# Patient Record
Sex: Female | Born: 1945 | Race: Black or African American | Hispanic: No | Marital: Married | Smoking: Never smoker
Health system: Southern US, Community
[De-identification: ages and names within clinical notes are randomized; demographics above are authoritative.]

## PROBLEM LIST (undated history)

## (undated) DIAGNOSIS — R011 Cardiac murmur, unspecified: Secondary | ICD-10-CM

## (undated) DIAGNOSIS — K635 Polyp of colon: Secondary | ICD-10-CM

## (undated) DIAGNOSIS — J40 Bronchitis, not specified as acute or chronic: Secondary | ICD-10-CM

## (undated) DIAGNOSIS — E162 Hypoglycemia, unspecified: Secondary | ICD-10-CM

## (undated) DIAGNOSIS — I10 Essential (primary) hypertension: Secondary | ICD-10-CM

## (undated) DIAGNOSIS — Z460 Encounter for fitting and adjustment of spectacles and contact lenses: Secondary | ICD-10-CM

## (undated) DIAGNOSIS — R0989 Other specified symptoms and signs involving the circulatory and respiratory systems: Secondary | ICD-10-CM

## (undated) DIAGNOSIS — Z972 Presence of dental prosthetic device (complete) (partial): Secondary | ICD-10-CM

## (undated) HISTORY — DX: Encounter for fitting and adjustment of spectacles and contact lenses: Z46.0

## (undated) HISTORY — DX: Bronchitis, not specified as acute or chronic: J40

## (undated) HISTORY — DX: Polyp of colon: K63.5

## (undated) HISTORY — DX: Other specified symptoms and signs involving the circulatory and respiratory systems: R09.89

## (undated) HISTORY — DX: Essential (primary) hypertension: I10

## (undated) HISTORY — DX: Cardiac murmur, unspecified: R01.1

## (undated) HISTORY — DX: Presence of dental prosthetic device (complete) (partial): Z97.2

## (undated) HISTORY — DX: Hypoglycemia, unspecified: E16.2

---

## 1994-01-15 HISTORY — PX: ABDOMINAL HYSTERECTOMY: SHX81

## 2002-04-20 ENCOUNTER — Encounter: Payer: Self-pay | Admitting: Gastroenterology

## 2002-04-20 ENCOUNTER — Ambulatory Visit (HOSPITAL_COMMUNITY): Admission: RE | Admit: 2002-04-20 | Discharge: 2002-04-20 | Payer: Self-pay | Admitting: Gastroenterology

## 2002-05-12 ENCOUNTER — Ambulatory Visit (HOSPITAL_COMMUNITY): Admission: RE | Admit: 2002-05-12 | Discharge: 2002-05-12 | Payer: Self-pay | Admitting: Gastroenterology

## 2002-06-22 ENCOUNTER — Encounter: Payer: Self-pay | Admitting: Family Medicine

## 2002-06-22 ENCOUNTER — Encounter: Admission: RE | Admit: 2002-06-22 | Discharge: 2002-06-22 | Payer: Self-pay | Admitting: Family Medicine

## 2002-07-23 ENCOUNTER — Encounter: Payer: Self-pay | Admitting: General Surgery

## 2002-07-29 ENCOUNTER — Encounter (INDEPENDENT_AMBULATORY_CARE_PROVIDER_SITE_OTHER): Payer: Self-pay | Admitting: *Deleted

## 2002-07-29 ENCOUNTER — Ambulatory Visit (HOSPITAL_COMMUNITY): Admission: RE | Admit: 2002-07-29 | Discharge: 2002-07-30 | Payer: Self-pay | Admitting: General Surgery

## 2002-07-29 ENCOUNTER — Encounter: Payer: Self-pay | Admitting: General Surgery

## 2002-08-16 HISTORY — PX: CHOLECYSTECTOMY: SHX55

## 2004-05-09 ENCOUNTER — Emergency Department (HOSPITAL_COMMUNITY): Admission: EM | Admit: 2004-05-09 | Discharge: 2004-05-09 | Payer: Self-pay | Admitting: Emergency Medicine

## 2004-08-16 ENCOUNTER — Emergency Department (HOSPITAL_COMMUNITY): Admission: EM | Admit: 2004-08-16 | Discharge: 2004-08-16 | Payer: Self-pay | Admitting: Emergency Medicine

## 2005-10-21 ENCOUNTER — Ambulatory Visit (HOSPITAL_COMMUNITY): Admission: RE | Admit: 2005-10-21 | Discharge: 2005-10-21 | Payer: Self-pay | Admitting: Obstetrics and Gynecology

## 2005-11-12 ENCOUNTER — Ambulatory Visit: Admission: RE | Admit: 2005-11-12 | Discharge: 2005-11-12 | Payer: Self-pay | Admitting: Gynecologic Oncology

## 2005-11-15 HISTORY — PX: OTHER SURGICAL HISTORY: SHX169

## 2005-12-10 ENCOUNTER — Encounter (INDEPENDENT_AMBULATORY_CARE_PROVIDER_SITE_OTHER): Payer: Self-pay | Admitting: *Deleted

## 2005-12-10 ENCOUNTER — Inpatient Hospital Stay (HOSPITAL_COMMUNITY): Admission: RE | Admit: 2005-12-10 | Discharge: 2005-12-13 | Payer: Self-pay | Admitting: Obstetrics and Gynecology

## 2008-04-19 ENCOUNTER — Encounter: Admission: RE | Admit: 2008-04-19 | Discharge: 2008-04-19 | Payer: Self-pay | Admitting: Family Medicine

## 2008-12-14 ENCOUNTER — Ambulatory Visit: Admission: RE | Admit: 2008-12-14 | Discharge: 2008-12-14 | Payer: Self-pay | Admitting: Family Medicine

## 2008-12-14 ENCOUNTER — Ambulatory Visit: Payer: Self-pay | Admitting: Vascular Surgery

## 2009-05-01 ENCOUNTER — Encounter: Admission: RE | Admit: 2009-05-01 | Discharge: 2009-05-01 | Payer: Self-pay | Admitting: Family Medicine

## 2010-03-15 ENCOUNTER — Emergency Department (HOSPITAL_COMMUNITY): Admission: EM | Admit: 2010-03-15 | Discharge: 2010-03-15 | Payer: Self-pay | Admitting: Emergency Medicine

## 2010-06-29 ENCOUNTER — Ambulatory Visit (HOSPITAL_COMMUNITY): Admission: RE | Admit: 2010-06-29 | Payer: Self-pay | Source: Home / Self Care | Admitting: Obstetrics and Gynecology

## 2010-07-05 ENCOUNTER — Other Ambulatory Visit (HOSPITAL_COMMUNITY): Payer: Self-pay | Admitting: Obstetrics and Gynecology

## 2010-07-05 DIAGNOSIS — N939 Abnormal uterine and vaginal bleeding, unspecified: Secondary | ICD-10-CM

## 2010-08-03 ENCOUNTER — Other Ambulatory Visit (HOSPITAL_COMMUNITY): Payer: Self-pay

## 2010-08-03 ENCOUNTER — Ambulatory Visit (HOSPITAL_COMMUNITY)
Admission: RE | Admit: 2010-08-03 | Discharge: 2010-08-03 | Disposition: A | Discharge status: Home / Self Care | Payer: BC Managed Care – PPO | Source: Ambulatory Visit | Attending: Obstetrics and Gynecology | Admitting: Obstetrics and Gynecology

## 2010-08-03 DIAGNOSIS — J9819 Other pulmonary collapse: Secondary | ICD-10-CM | POA: Insufficient documentation

## 2010-08-03 DIAGNOSIS — N939 Abnormal uterine and vaginal bleeding, unspecified: Secondary | ICD-10-CM

## 2010-08-03 DIAGNOSIS — K4090 Unilateral inguinal hernia, without obstruction or gangrene, not specified as recurrent: Secondary | ICD-10-CM | POA: Insufficient documentation

## 2010-08-03 DIAGNOSIS — D1809 Hemangioma of other sites: Secondary | ICD-10-CM | POA: Insufficient documentation

## 2010-08-03 DIAGNOSIS — D3500 Benign neoplasm of unspecified adrenal gland: Secondary | ICD-10-CM | POA: Insufficient documentation

## 2010-08-03 DIAGNOSIS — K5730 Diverticulosis of large intestine without perforation or abscess without bleeding: Secondary | ICD-10-CM | POA: Insufficient documentation

## 2010-08-03 MED ORDER — IOHEXOL 300 MG/ML  SOLN
100.0000 mL | Freq: Once | INTRAMUSCULAR | Status: AC | PRN
  Administered 2010-08-03: 100 mL via INTRAVENOUS

## 2010-08-30 LAB — DIFFERENTIAL
Basophils Relative: 0 % (ref 0–1)
Eosinophils Absolute: 0.1 10*3/uL (ref 0.0–0.7)
Eosinophils Relative: 1 % (ref 0–5)
Monocytes Absolute: 0.1 10*3/uL (ref 0.1–1.0)
Neutrophils Relative %: 74 % (ref 43–77)

## 2010-08-30 LAB — CBC
Hemoglobin: 13.1 g/dL (ref 12.0–15.0)
MCHC: 33.3 g/dL (ref 30.0–36.0)
MCV: 82.8 fL (ref 78.0–100.0)
RBC: 4.75 MIL/uL (ref 3.87–5.11)
WBC: 8.5 10*3/uL (ref 4.0–10.5)

## 2010-08-30 LAB — COMPREHENSIVE METABOLIC PANEL
ALT: 24 U/L (ref 0–35)
Albumin: 3.9 g/dL (ref 3.5–5.2)
Alkaline Phosphatase: 73 U/L (ref 39–117)
BUN: 9 mg/dL (ref 6–23)
CO2: 28 mEq/L (ref 19–32)
Creatinine, Ser: 0.93 mg/dL (ref 0.4–1.2)
GFR calc Af Amer: >60 mL/min (ref >60)
Total Bilirubin: 0.6 mg/dL (ref 0.3–1.2)

## 2010-11-02 NOTE — Op Note (Signed)
NAME:  Connie James, Connie James                        ACCOUNT NO.:  [id number]   MEDICAL RECORD NO.:  [id number]                   PATIENT TYPE:  OIB   LOCATION:  NA                                   FACILITY:  [facility]   PHYSICIAN:  Adolph Pollack, M.D.            DATE OF BIRTH:  [birth year]   DATE OF PROCEDURE:  DATE OF DISCHARGE:                                 OPERATIVE REPORT   PREOPERATIVE DIAGNOSIS:  Chronic cholecystectomy.   POSTOPERATIVE DIAGNOSIS:  Chronic cholecystectomy.   OPERATION:  Laparoscopic cholecystectomy with intraoperative cholangiogram.   SURGEON:  Adolph Pollack, M.D.   ASSISTANT:  Anselm Pancoast. Zachery Dakins, M.D.   ANESTHESIA:  General   INDICATIONS FOR PROCEDURE:  The patient is a 65 year old female with fairly  classic biliary colic type pains but a normal ultrasound and HIDA scan.  She  has undergone upper endoscopy and cholecystectomy as well.  She has tried  Aciphex.  That has had no affect on her symptoms but a low fat diet has had  some.  Had a long discussion with her about the possibility of this being  gallbladder in origin.  We went over the procedure and risks and she decided  she would like to proceed with the operation.  She knows that it may not  definitely take all of her symptoms away.   DESCRIPTION OF PROCEDURE:  She was seen in the holding room and brought to  the operating room and placed supine on the operating room and a general  anesthetic was administered.  The abdomen was then sterilely prepped and  draped.  Dilute Marcaine solution was infiltrated in the subcutaneous  tissues in the infraumbilical region and a small infraumbilical incision was  made, incising the skin and subcutaneous tissue sharply.  The midline fascia  was identified and a 1.2 cm incision made in the midline fascia.  The  peritoneal cavity was then entered bluntly and under direct vision.  A  Hasson trocar was introduced into the peritoneal cavity and a  pneumoperitoneum was created by insufflation of CO2 gas.   The laparoscope was introduced and she was placed in the reverse  Trendelenburg position with the right side tilted slightly up.  Under direct  vision an 11 mm trocar was placed in the epigastrium and two 5 mm trocars  were placed in the right mid abdomen.  I visualized the area where the  subumbilical trocar was and the omental adhesions around this were took down  bluntly and sharply.  This allowed me to see the subumbilical trocar better.  No bowel injury was noted.   The gallbladder was identified and the fundus was grasped and omental  adhesions were noted to the body and infundibulum of the gallbladder and  these were taken down bluntly and with electrocautery.  The fundus was then  retracted to the right shoulder and infundibulum grasped and completely  mobilized.  The  cystic artery was found to be anterior to the cystic duct  and it was clipped and divided.  A window was then created around the cystic  duct and was clipped at the cystic duct gallbladder junction.  A small  incision was made in the cystic duct and a cholangiocatheter was introduced.  A cholangiogram was then performed.   Under real time fluoroscopy dilute contrast material was injected into the  cystic duct.  She had a long cystic duct.  The common hepatic, right and  left hepatic and common bile ducts all filled.  The common bile duct  contrast spilled into the duodenum from the common bile duct without obvious  evidence of obstruction.  Final report is pending the radiologist  interpretation.   The cholangiocatheter was then removed.  The cystic duct was clipped three  times proximally and divided.  The gallbladder was then dissected free from  the liver bed with electrocautery and placed in an Endopouch bag.  The  gallbladder fossa was examined, irrigated and bleeding points controlled  with cautery.  It was then examined two more times and no  bleeding or bowel  leakage was noted.   Irrigation fluid was then evacuated.  The gallbladder in the Endopouch bag  was removed through the subumbilical port and the subumbilical fascia defect  was closed by tightening by using a 0 Vicryl pursestring suture.  This was  closed under direct vision.  The remaining trocars were removed and  pneumoperitoneum was released.  The skin incisions were closed with 4-0  Monocryl subcuticular stitches.  Steri-Strips and sterile dressings were  applied.   She tolerated the procedure well without any apparent complications.  She is  taken into the recovery room in satisfactory condition.                                               Adolph Pollack, M.D.    Kari Baars  D:  07/29/2002  T:  07/29/2002  Job:  [id number]   cc:   Anselmo Rod, M.D.  [address]  [address]  [place]  [place] [address]  Fax: [phone]   Tammy R. Collins Scotland, M.D.  [address]  [place]  [place] [address]  Fax: [phone]

## 2010-11-02 NOTE — Op Note (Signed)
   NAME:  Connie James, Connie James                        ACCOUNT NO.:  [id number]   MEDICAL RECORD NO.:  [id number]                   PATIENT TYPE:  AMB   LOCATION:  ENDO                                 FACILITY:  [facility]   PHYSICIAN:  Anselmo Rod, M.D.               DATE OF BIRTH:  1946/03/17   DATE OF PROCEDURE:  05/12/2002  DATE OF DISCHARGE:                                 OPERATIVE REPORT   PROCEDURE PERFORMED:  Esophagogastroduodenoscopy.   ENDOSCOPIST:  Charna Elizabeth, M.D.   INSTRUMENT USED:  Olympus video panendoscope.   INDICATIONS FOR PROCEDURE:  Epigastric pain with rectal bleeding in a 65-  year-old African-American female.  Rule out ulcer disease, esophagitis,  gastritis, etc.   PREPROCEDURE PREPARATION:  Informed consent was procured from the patient.  The patient was fasted for eight hours prior to the procedure.   PREPROCEDURE PHYSICAL:  The patient had stable vital signs.  Neck supple,  chest clear to auscultation.  S1, S2 regular.  Abdomen soft with normal  bowel sounds.   DESCRIPTION OF PROCEDURE:  The patient was placed in the left lateral  decubitus position and sedated with 70 mg of Demerol and 7 mg of Versed  intravenously.  Once the patient was adequately sedated and maintained on  low-flow oxygen and continuous cardiac monitoring, the Olympus video  panendoscope was advanced through the mouth piece over the tongue into the  esophagus under direct vision.  The entire esophagus appeared normal with no  evidence of ring, stricture, masses, esophagitis or Barrett's mucosa.  The  scope was then advanced to the stomach.  The entire gastric mucosa and the  proximal small bowel appeared normal.   IMPRESSION:  Normal esophagogastroduodenoscopy.  No source of bleeding  identified.   RECOMMENDATIONS:  Proceed with the colonoscopy at this time.                                                 Anselmo Rod, M.D.    JNM/MEDQ  D:  05/12/2002  T:  05/12/2002  Job:   [id number]   cc:   Tammy R. Collins Scotland, M.D.  [address]  [place]  [place] [address]  Fax: [phone]

## 2010-11-02 NOTE — Discharge Summary (Signed)
NAMEELIZBETH, [name]              ACCOUNT NO.:  [id number]   MEDICAL RECORD NO.:  [id number]          PATIENT TYPE:  INP   LOCATION:  1611                         FACILITY:  [facility]   PHYSICIAN:  Guy Sandifer. Henderson Cloud, M.D. DATE OF BIRTH:  [birth year]   DATE OF ADMISSION:  12/10/2005  DATE OF DISCHARGE:  12/13/2005                                 DISCHARGE SUMMARY   ADMISSION DIAGNOSIS:  Left adnexal mass.   DISCHARGE DIAGNOSIS:  Left adnexal mass.   PROCEDURE:  On December 10, 2005, an exploratory laparotomy with BSO, extensive  lysis of adhesions and an umbilical hernia repair.   REASON FOR ADMISSION:  This patient is a 65 year old married  African/American female, G3, P2, status post hysterectomy who has a left  adnexal mass.  The details are dictated on the history and physical.  She is  admitted for surgical management.   HOSPITAL COURSE:  The patient undergoes the above procedure.  On the evening  of surgery she has good pain control. Her vital signs are stable and clear  urine output.  On the first postoperative day she was not yet passing  flatus, ambulating, not yet taking a regular diet, with good pain relief.  Her vital signs are stable.  The hemoglobin is 10, white count 14.4,  platelets 298,000.  The metabolic panel is okay.  On the evening of December 12, 2005, she is ambulating and tolerating a regular diet.  Her vital signs are  stable.  She is afebrile.  She is still having some discomfort and she is  observed overnight.  On the day of discharge she is feeling better, passing  flatus.  She remains afebrile with stable vital signs.  The pathology  returns with a benign cytology on washings.  Diet is regular and tolerated.  No lifting, no operation of automobiles, no vaginal entry.   She is to call the office for problems including, but not limited to a  temperature of 101 degrees, persistent nausea and vomiting, increasing pain.   DISCHARGE MEDICATIONS:  1.  Percocet 5/325  mg, #40, one to two p.o. q.6h. p.r.n.  2.  Ibuprofen 600 mg q.6h. p.r.n.  3.  She will resume her preoperative medications; however, we will hold her      estrogen patch for the next one to two weeks.   FOLLOWUP:  In the office.      Guy Sandifer Henderson Cloud, M.D.  Electronically Signed     JET/MEDQ  D:  12/13/2005  T:  12/13/2005  Job:  [id number]
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
## 2010-11-02 NOTE — Op Note (Signed)
NAME:  Connie James, Connie James                        ACCOUNT NO.:  [id number]   MEDICAL RECORD NO.:  [id number]                   PATIENT TYPE:  AMB   LOCATION:  ENDO                                 FACILITY:  [facility]   PHYSICIAN:  Anselmo Rod, M.D.               DATE OF BIRTH:  1945/11/29   DATE OF PROCEDURE:  05/12/2002  DATE OF DISCHARGE:                                 OPERATIVE REPORT   PROCEDURE PERFORMED:  Colonoscopy.   ENDOSCOPIST:  Charna Elizabeth, M.D.   INSTRUMENT USED:  Olympus video colonoscope.   INDICATIONS FOR PROCEDURE:  Rectal bleeding in a 65 year old African-  American female with a history of colonic polyps in her brother.  Rule out  colonic polyps, masses, hemorrhoids, etc.   PREPROCEDURE PREPARATION:  Informed consent was procured from the patient.  The patient was fasted for eight hours prior to the procedure and prepped  with a bottle of magnesium citrate and a gallon of NuLytely the night prior  to the procedure.   PREPROCEDURE PHYSICAL:  The patient had stable vital signs.  Neck supple.  Chest clear to auscultation.  S1 and S2 regular.  Abdomen soft with normal  bowel sounds.   DESCRIPTION OF PROCEDURE:  The patient was placed in left lateral decubitus  position and no additional sedation was used for the colonoscopy.  The  patient had an EGD prior to the colonoscopy.  The patient was given Cipro  400 mg IV for MVP prophylaxis.  Once the patient was adequately sedated and  maintained on low flow oxygen and continuous cardiac monitoring, the Olympus  video colonoscope was advanced from the rectum to the cecum without  difficulty.  Except for an isolated diverticulum in the left colon and small  internal hemorrhoids, no other abnormalities were noted.  The entire colonic  mucosa appeared healthy and without lesions.  The patient tolerated the  procedure well without complication.  The appendicular orifice and ileocecal  valve were clearly visualized and  photographed.   IMPRESSION:  1. Small nonbleeding internal hemorrhoids.  2. An isolated diverticulum in the left colon.  3. No masses or polyps seen.   RECOMMENDATIONS:  1. A high fiber diet with liberal fluid intake has been recommended.  2.     Outpatient follow-up in the next two weeks for further recommendation.  3. Repeat colorectal cancer screening in the next five years unless the     patient develops any abnormal symptoms in the interim.                                                   Anselmo Rod, M.D.    JNM/MEDQ  D:  05/12/2002  T:  05/12/2002  Job:  [id number]   cc:  Tammy R. Collins Scotland, M.D.  [address]  [place]  [place] [address]  Fax: [phone]

## 2010-11-02 NOTE — Op Note (Signed)
Connie James, RAINFORD              ACCOUNT NO.:  [id number]   MEDICAL RECORD NO.:  [id number]          PATIENT TYPE:  INP   LOCATION:  0005                         FACILITY:  [facility]   PHYSICIAN:  Paola A. Duard Brady, MD    DATE OF BIRTH:  1945-09-13   DATE OF PROCEDURE:  12/10/2005  DATE OF DISCHARGE:                                 OPERATIVE REPORT   PREOPERATIVE DIAGNOSIS:  Left complex adnexal mass, normal CA-125.   POSTOPERATIVE DIAGNOSIS:  Left complex adnexal mass, normal CA-125.   PROCEDURE:  Exploratory laparotomy, BSO, extensive lysis of adhesions for 30  minutes, umbilical hernia repair.   SURGEON:  Paola A. Duard Brady, M.D. and Guy Sandifer. Henderson Cloud, M.D.   ASSISTANT:  Telford Nab, R.N.   ANESTHESIA:  General.   SPECIMENS:  Bilateral tubes and ovaries, washings.   ESTIMATED BLOOD LOSS:  300 mL.   URINE OUTPUT:  350 mL.   IV FLUIDS:  1400 mL.   ANESTHESIA:  General.   OPERATIVE FINDINGS:  Included extensive adhesions of the omentum to the  anterior abdominal wall.  There was a 6 cm left complex adnexal mass which  had the overlying rectosigmoid colon draped over.  There was normal-  appearing right tube and ovary.  Remainder of the abdominal survey was  unremarkable.   DESCRIPTION OF PROCEDURE:  The patient was taken to the operating room,  placed in the supine position where general anesthesia was induced.  She was  then placed in dorsal lithotomy position with all appropriate precautions  being taken.  The abdomen was prepped in the usual sterile fashion as was  the perineum. Foley catheter was inserted in the bladder sterile conditions.  She was then draped in the usual sterile fashion.  A vertical infraumbilical  midline incision was made with the knife and carried down to the underlying  fascia sharply.  The fascia was identified, scored and the fascial incision  was extended superiorly and inferiorly.  Rectus bellies were adherent to the  overlying fascia and  this was dissected off using Bovie cautery.  The  peritoneal cavity was entered.  At this point the dense omental adhesions to  anterior abdominal wall were identified.  The abdominal wall on the  patient's left side fascia was grasped with Kocher clamps.  The omentum was  dissected free using sharp dissection and cautery where indicated.  A  similar procedure was performed to free the omentum from the overlying  abdominal wall on her right side.  Buchwalter self-retaining retractor was  then placed on the bed and short retractor blades were placed laterally  again with all appropriate precautions being. Bladder was taken down and  dissected free.  The small bowel was packed out of the way using moist  laparotomy sponges.  Washings were obtained.  At this point the rectosigmoid  colon was noted to be adherent to the left pelvic sidewall and draping the  ovarian mass.  The rectosigmoid epiploica and adhesions were taken off the  left pelvic sidewall using sharp meticulous dissection.  The ovary was  identified.  The retroperitoneal space was  opened.  The ureter was  identified on left side.  A window was made between the IP and the ureter.  The IP was clamped x2, transected and ligated with 0 Vicryl.  Sharp  dissection continued to bring the ovary up and out of the left side of the  pelvis.  The adhesions to the colon were taken down meticulously using sharp  dissection. The ovary was freed and was taken to pathology.  Frozen section  was consistent with benign disease.   Our attention was then drawn to the right side wall.  The old remnant of her  round ligament was identified. Retroperitoneal space was opened along the  posterior leaf of broad ligament.  The ureter was identified.  A window was  made between the infundibulopelvic vessels and the ureter.  The IP was  clamped x2, transected and suture ligated with 0 Vicryl.  We then freed the  remainder of the ovarian adhesions off the left  side wall.  It was removed.  All the pedicles were noted to be hemostatic.  The abdomen and pelvis were  copiously irrigated.  Again pedicles were identified noted be hemostatic.  The rectosigmoid colon was free from the area dissection and was hemostatic.  The ureters bilaterally were peristalsing and nondilated.  The packs were  removed without difficulty.  The omentum was inspected for any bleeding and  hemostasis obtained using pinpoint cautery along the areas where the omentum  was freed from abdomen wall.  All laparotomy sponges removed.  The fascia  was closed using a running #1 PDS, the subcu tissues were irrigated and  pinpoint cautery was again used.  The skin was closed using staples.   The patient was taken to the recovery room in stable condition.  All  instrument, needle and laparotomy counts were correct x2.      Paola A. Duard Brady, MD  Electronically Signed     PAG/MEDQ  D:  12/10/2005  T:  12/10/2005  Job:  [id number]   cc:   Guy Sandifer. Henderson Cloud, M.D.  Fax: [phone]   Telford Nab, R.N.  [address]  [place], [place] [address]   [facility], [place], [place]
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
## 2011-01-22 ENCOUNTER — Ambulatory Visit (INDEPENDENT_AMBULATORY_CARE_PROVIDER_SITE_OTHER): Payer: Self-pay | Admitting: Surgery

## 2011-01-23 ENCOUNTER — Ambulatory Visit (INDEPENDENT_AMBULATORY_CARE_PROVIDER_SITE_OTHER): Payer: Self-pay | Admitting: General Surgery

## 2011-01-24 ENCOUNTER — Encounter (INDEPENDENT_AMBULATORY_CARE_PROVIDER_SITE_OTHER): Payer: Self-pay | Admitting: General Surgery

## 2011-01-25 ENCOUNTER — Encounter (INDEPENDENT_AMBULATORY_CARE_PROVIDER_SITE_OTHER): Payer: Self-pay | Admitting: General Surgery

## 2011-01-25 ENCOUNTER — Ambulatory Visit (INDEPENDENT_AMBULATORY_CARE_PROVIDER_SITE_OTHER): Payer: BC Managed Care – PPO | Admitting: General Surgery

## 2011-01-25 VITALS — BP 160/102 | HR 64 | Temp 96.5°F

## 2011-01-25 DIAGNOSIS — K648 Other hemorrhoids: Secondary | ICD-10-CM

## 2011-01-25 NOTE — Progress Notes (Signed)
Subjective:     Patient ID: Connie James, female   DOB: July 20, 1945, 65 y.o.   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 Dr. Dewain Penning is her regular physician and she was referred to Dr. Evette Cristal had a [facility] and I have his noted done prior to the colonoscopy and he was taken it was probably diverticulosis because of a slow massive bleeding that occurs intermittently for a period of months set her the colonoscopy she has no report from the colonoscopy and there was no report referred to me but I called and taught with Dr.Ganem and he said he talk with her after the procedure but most likely she was still sedated and she went pain  picture copy of the colonoscopy report which I reviewed with the patient the colon was completely inspected including the ileocecal valve cecum she does have a few diverticulosis in the sigmoid colon and has some second degree internal hemorrhoids plus a few external   Review of Systems  Constitutional: Negative.   HENT: Negative.   Respiratory: Negative.   Cardiovascular: Negative.   Gastrointestinal: Positive for blood in stool. Negative for vomiting, abdominal pain and constipation.  Genitourinary: Negative.   Skin: Negative.   Review of Systems  Constitutional: Negative.   HENT: Negative.   Respiratory: Negative.   Cardiovascular: Negative.   Gastrointestinal: Positive for blood in stool. Negative for vomiting, abdominal pain and constipation.  Genitourinary: Negative.   Skin: Negative.        Objective:   Physical Exam. temporal temperature is 96.5 F  (35.8 C). Her blood pressure is 160/102 and her pulse is 64.  he patient at first appeared annoyed why she was referred today surgeon for surgery. After reviewing her notes from from [facility] and reviewing the colonoscopy report wthi her er abdominal exam shows no tenderness no massesand normal rectal examination she has mild external hemorrhoids no thrombosed hemorrhoids and no spasmanoscopic exam showed 2 internal hemorrhoids grade 2 which might have bled. I reviewed the hemorrhoidal booklet with her spouse the options and think that conservative management could be triedthat rubberbanding or possibly injection of the hemorrhoid and internal hemorrhoid would work better if she has an episode of active bleeding a anoscopic exam at the time of bleeding or shortly afterwards would definitely confirm that the bleeding is hemorrhoidal and not diverticular bleeding I expect that the bleeding was from her hemorrhoids and explained that hemorrhoidal bleeding internal hemorrhoids does not cause ut more common with the patient have little episodes of rectal bleeding associated with bowel movement    Assessment:     I will reexamine her in approximately 3-4 weeks she will try hemorrhoid suppositories twice a day but since she denies pain or frequent bleed in the most important factor is avoiding constipation which she presently denies    Plan:     followup in 3-4 weeks. She understandsthe rubber band and all injections our office procedure which could be performed with her resuming normal activity in 1-2 days she should not need a formal internal Hemorrhoidectomy but would like to be sure that the hemorrhoids  are the source of her bleed.

## 2011-01-25 NOTE — Patient Instructions (Signed)
Regulate her stool consistency with the use of stool softeners oral laxatives as needed. I would recommend a hemorrhoid suppository back twice a day if there is any hemorrhoid symptoms but avoiding constipation has been most needed problem. Return to see me in 3-4 weeks for followup endoscopic exam and read the booklet of diet or rub abandoned and injections if you're having problems with acute bleeding and call [facility] they can reexamine you in the next day or 2 following an episode of bleeding

## 2011-02-27 ENCOUNTER — Encounter (INDEPENDENT_AMBULATORY_CARE_PROVIDER_SITE_OTHER): Payer: BC Managed Care – PPO | Admitting: General Surgery

## 2011-03-20 ENCOUNTER — Other Ambulatory Visit (HOSPITAL_COMMUNITY): Payer: Self-pay | Admitting: Family Medicine

## 2011-03-26 ENCOUNTER — Ambulatory Visit (HOSPITAL_COMMUNITY): Payer: BC Managed Care – PPO

## 2011-03-27 ENCOUNTER — Ambulatory Visit (HOSPITAL_COMMUNITY): Payer: BC Managed Care – PPO

## 2011-03-29 ENCOUNTER — Ambulatory Visit (HOSPITAL_COMMUNITY)
Admission: RE | Admit: 2011-03-29 | Discharge: 2011-03-29 | Disposition: A | Discharge status: Home / Self Care | Payer: BC Managed Care – PPO | Source: Ambulatory Visit | Attending: Family Medicine | Admitting: Family Medicine

## 2011-03-29 DIAGNOSIS — N9489 Other specified conditions associated with female genital organs and menstrual cycle: Secondary | ICD-10-CM | POA: Insufficient documentation

## 2011-03-29 DIAGNOSIS — K838 Other specified diseases of biliary tract: Secondary | ICD-10-CM

## 2011-03-29 DIAGNOSIS — R109 Unspecified abdominal pain: Secondary | ICD-10-CM | POA: Insufficient documentation

## 2011-08-26 ENCOUNTER — Other Ambulatory Visit (HOSPITAL_COMMUNITY): Payer: Self-pay | Admitting: Family Medicine

## 2011-08-26 DIAGNOSIS — E278 Other specified disorders of adrenal gland: Secondary | ICD-10-CM

## 2011-09-19 ENCOUNTER — Ambulatory Visit (HOSPITAL_COMMUNITY)
Admission: RE | Admit: 2011-09-19 | Discharge: 2011-09-19 | Disposition: A | Discharge status: Home / Self Care | Payer: BC Managed Care – PPO | Source: Ambulatory Visit | Attending: Family Medicine | Admitting: Family Medicine

## 2011-09-19 DIAGNOSIS — R109 Unspecified abdominal pain: Secondary | ICD-10-CM | POA: Insufficient documentation

## 2011-09-19 DIAGNOSIS — E278 Other specified disorders of adrenal gland: Secondary | ICD-10-CM

## 2011-09-19 DIAGNOSIS — D3500 Benign neoplasm of unspecified adrenal gland: Secondary | ICD-10-CM | POA: Insufficient documentation

## 2011-09-19 DIAGNOSIS — E279 Disorder of adrenal gland, unspecified: Secondary | ICD-10-CM | POA: Insufficient documentation

## 2011-09-19 DIAGNOSIS — K769 Liver disease, unspecified: Secondary | ICD-10-CM | POA: Insufficient documentation

## 2011-09-19 MED ORDER — IOHEXOL 300 MG/ML  SOLN
100.0000 mL | Freq: Once | INTRAMUSCULAR | Status: AC | PRN
  Administered 2011-09-19: 100 mL via INTRAVENOUS

## 2012-10-12 ENCOUNTER — Encounter (HOSPITAL_COMMUNITY): Payer: Self-pay | Admitting: Emergency Medicine

## 2012-10-12 ENCOUNTER — Emergency Department (HOSPITAL_COMMUNITY)
Admission: EM | Admit: 2012-10-12 | Discharge: 2012-10-12 | Disposition: A | Discharge status: Home / Self Care | Payer: BC Managed Care – PPO | Attending: Emergency Medicine | Admitting: Emergency Medicine

## 2012-10-12 DIAGNOSIS — G40909 Epilepsy, unspecified, not intractable, without status epilepticus: Secondary | ICD-10-CM | POA: Insufficient documentation

## 2012-10-12 DIAGNOSIS — K644 Residual hemorrhoidal skin tags: Secondary | ICD-10-CM | POA: Insufficient documentation

## 2012-10-12 DIAGNOSIS — K5730 Diverticulosis of large intestine without perforation or abscess without bleeding: Secondary | ICD-10-CM | POA: Insufficient documentation

## 2012-10-12 DIAGNOSIS — R110 Nausea: Secondary | ICD-10-CM | POA: Insufficient documentation

## 2012-10-12 DIAGNOSIS — R1032 Left lower quadrant pain: Secondary | ICD-10-CM | POA: Insufficient documentation

## 2012-10-12 DIAGNOSIS — R61 Generalized hyperhidrosis: Secondary | ICD-10-CM | POA: Insufficient documentation

## 2012-10-12 DIAGNOSIS — Z8679 Personal history of other diseases of the circulatory system: Secondary | ICD-10-CM | POA: Insufficient documentation

## 2012-10-12 DIAGNOSIS — K648 Other hemorrhoids: Secondary | ICD-10-CM | POA: Insufficient documentation

## 2012-10-12 DIAGNOSIS — K625 Hemorrhage of anus and rectum: Secondary | ICD-10-CM

## 2012-10-12 DIAGNOSIS — Z860100 Personal history of colon polyps, unspecified: Secondary | ICD-10-CM | POA: Insufficient documentation

## 2012-10-12 DIAGNOSIS — Z79899 Other long term (current) drug therapy: Secondary | ICD-10-CM | POA: Insufficient documentation

## 2012-10-12 DIAGNOSIS — I10 Essential (primary) hypertension: Secondary | ICD-10-CM | POA: Insufficient documentation

## 2012-10-12 DIAGNOSIS — R55 Syncope and collapse: Secondary | ICD-10-CM

## 2012-10-12 DIAGNOSIS — Z8709 Personal history of other diseases of the respiratory system: Secondary | ICD-10-CM | POA: Insufficient documentation

## 2012-10-12 LAB — URINALYSIS, ROUTINE W REFLEX MICROSCOPIC
Bilirubin Urine: NEGATIVE
Ketones, ur: NEGATIVE mg/dL
Nitrite: NEGATIVE
Specific Gravity, Urine: 1.015 (ref 1.005–1.030)
Urobilinogen, UA: 0.2 mg/dL (ref 0.0–1.0)

## 2012-10-12 LAB — COMPREHENSIVE METABOLIC PANEL
AST: 13 U/L (ref 0–37)
Alkaline Phosphatase: 69 U/L (ref 39–117)
CO2: 28 mEq/L (ref 19–32)
GFR calc Af Amer: 82 mL/min — ABNORMAL LOW (ref >90)
GFR calc non Af Amer: 71 mL/min — ABNORMAL LOW (ref >90)
Potassium: 3.7 mEq/L (ref 3.5–5.1)
Sodium: 133 mEq/L — ABNORMAL LOW (ref 135–145)
Total Bilirubin: 0.2 mg/dL — ABNORMAL LOW (ref 0.3–1.2)

## 2012-10-12 LAB — CBC
MCH: 25.9 pg — ABNORMAL LOW (ref 26.0–34.0)
MCHC: 34 g/dL (ref 30.0–36.0)
MCV: 76.1 fL — ABNORMAL LOW (ref 78.0–100.0)
RDW: 15.7 % — ABNORMAL HIGH (ref 11.5–15.5)
WBC: 12.9 10*3/uL — ABNORMAL HIGH (ref 4.0–10.5)

## 2012-10-12 LAB — POCT I-STAT TROPONIN I: Troponin i, poc: 0 ng/mL (ref 0.00–0.08)

## 2012-10-12 NOTE — ED Notes (Signed)
Pt from home c/o of syncopal episode after having ABD pain and dark/black stools this AM. Pt states she has had blood in her stool last week. Pt is alert and oriented. Denies cp.sob/ c/o left hand pain and head ache.

## 2012-10-12 NOTE — ED Notes (Signed)
Family at bedside. 

## 2012-10-12 NOTE — ED Provider Notes (Signed)
History     CSN: [id number]  Arrival date & time 10/12/12  0715   None     Chief Complaint  Patient presents with  . Loss of Consciousness  . Abdominal Pain    (Consider location/radiation/quality/duration/timing/severity/associated sxs/prior treatment) HPI 67 yo F with HTN, h/o rectal bleeding presenting with syncopal episode and dark stools.  She reports bright red blood with a BM last week.  She had normal stools over the weekend.  This morning, she had some crampy lower abdominal pain and then had a dark looking stool this morning.  After the bowel movement, she got "sick," with nausea, dizziness, flushing, feeling like she was going to die.  The next thing she remembers, she was on the floor, with her husband over her.  She describes feeling out of it for a while longer, states had difficulty talking to the EMTs.  Currently, she denies any abdominal pain and is feeling back to normal.  Had a colonoscopy 3-4 years ago and was told she needed a repeat in 10 years.  She was treated for a respiratory infection with antibiotics about 2 weeks ago.  No fevers, chills, shortness of breath, chest pain, diarrhea.   Past Medical History  Diagnosis Date  . Hypertension   . Bronchitis   . Heart murmur   . Seizures   . Hypoglycemia   . Constipation   . Poor circulation     in hands  . Colon polyp   . Hemorrhoids   . Wears dentures   . Contact lens/glasses fitting     Past Surgical History  Procedure Laterality Date  . Abdominal hysterectomy  01/1994  . Cholecystectomy  08/2002  . Removal of tumor and ovary  11/2005    Family History  Problem Relation Age of Onset  . Heart disease Mother   . Alzheimer's disease Mother   . Emphysema Father   . Lung disease Father   . Hypertension Brother     History  Substance Use Topics  . Smoking status: Never Smoker   . Smokeless tobacco: Not on file  . Alcohol Use: No    OB History   Grav Para Term Preterm Abortions TAB SAB Ect Mult  Living                  Review of Systems  Constitutional: Negative for fever, chills and diaphoresis.       Flushing  HENT: Negative.   Eyes: Negative.   Respiratory: Negative.   Cardiovascular: Negative.   Gastrointestinal: Positive for nausea, abdominal pain and blood in stool. Negative for vomiting, diarrhea and constipation.  Genitourinary: Negative.   Musculoskeletal: Negative.   Neurological: Positive for dizziness.  All other systems reviewed and are negative.    Allergies  Review of patient's allergies indicates no known allergies.  Home Medications   Current Outpatient Rx  Name  Route  Sig  Dispense  Refill  . BuPROPion HCl (WELLBUTRIN PO)   Oral   Take by mouth. Confirm dosage with patient. Not indicated on history form.          . diltiazem (TIAZAC) 240 MG 24 hr capsule   Oral   Take 240 mg by mouth daily.           . Estradiol (CLIMARA TD)   Transdermal   Place onto the skin. Confirm dosage with patient. Not indicated on history form.          . fish oil-omega-3 fatty acids 1000  MG capsule   Oral   Take 2 g by mouth daily.           Marland Kitchen HYDRALAZINE-HCTZ PO   Oral   Take by mouth. Confirm dosage with patient. Not indicated on history form.          . lansoprazole (PREVACID) 30 MG capsule   Oral   Take 30 mg by mouth daily.           . Loratadine (CLARITIN PO)   Oral   Take by mouth as needed.             BP 129/59  Pulse 59  Temp(Src) 98.1 F (36.7 C)  Resp 22  SpO2 100%  Physical Exam  Constitutional: She is oriented to person, place, and time. She appears well-developed and well-nourished. No distress.  HENT:  Head: Normocephalic and atraumatic.  Right Ear: External ear normal.  Left Ear: External ear normal.  Mouth/Throat: Oropharynx is clear and moist. No oropharyngeal exudate.  Eyes: Conjunctivae and EOM are normal. Right eye exhibits no discharge. Left eye exhibits no discharge.  Neck: Normal range of motion. Neck  supple.  Cardiovascular: Normal rate, regular rhythm, normal heart sounds and intact distal pulses.   No murmur heard. Pulmonary/Chest: Effort normal and breath sounds normal. No respiratory distress. She has no wheezes. She has no rales.  Abdominal: Soft. Bowel sounds are normal. She exhibits no distension. There is tenderness (mild in suprapubic and LLQ). There is no rebound and no guarding.  Genitourinary: Rectal exam shows external hemorrhoid and internal hemorrhoid. Rectal exam shows no mass, no tenderness and anal tone normal.  Musculoskeletal: Normal range of motion. She exhibits no edema and no tenderness.  Neurological: She is alert and oriented to person, place, and time. She exhibits normal muscle tone. Coordination normal.  Skin: Skin is warm and dry. No rash noted. She is not diaphoretic. No erythema.  Psychiatric: She has a normal mood and affect. Thought content normal.    ED Course  Procedures (including critical care time)  Labs Reviewed  CBC - Abnormal; Notable for the following:    WBC 12.9 (*)    Hemoglobin 11.7 (*)    HCT 34.4 (*)    MCV 76.1 (*)    MCH 25.9 (*)    RDW 15.7 (*)    All other components within normal limits  COMPREHENSIVE METABOLIC PANEL - Abnormal; Notable for the following:    Sodium 133 (*)    Glucose, Bld 101 (*)    Albumin 3.3 (*)    Total Bilirubin 0.2 (*)    GFR calc non Af Amer 71 (*)    GFR calc Af Amer 82 (*)    All other components within normal limits  URINALYSIS, ROUTINE W REFLEX MICROSCOPIC - Abnormal; Notable for the following:    APPearance CLOUDY (*)    All other components within normal limits  OCCULT BLOOD, POC DEVICE   No results found.   1. Rectal bleeding   2. Syncope      Date: 10/12/2012  Rate: 58  Rhythm: normal sinus rhythm  QRS Axis: normal  Intervals: PR prolonged  ST/T Wave abnormalities: normal  Conduction Disutrbances:first-degree A-V block   Narrative Interpretation: nsr with 1st degree AV block   Old EKG Reviewed: changes noted    MDM  67 yo F with HTN, h/o rectal bleeding presenting with bloody stool and syncopal episode.  Known internal and external hemorrhoids as well as diverticulosis.  DDx for  bleeding includes hemorrhoids, diverticulosis/itits.  Upper GI bleeding seems less likely with no epigastric pain or history of gastritis.  Colon cancer also seems unlikely with a recent colonoscopy.  Syncopal episode may be vaso-vagal, volume depletion from blood loss, cardiac.   I doubt she is significantly anemia or volume depleted given normal pulse and blood pressure.  Will get ekg and troponin to assess for cardiac causes.  CBC, CMP as well.  FOBT sent.   9:00AM: Labs reviewed.  Troponin negative, ecg normal.  Patient reports feeling fine, no nausea, dizziness, abdominal pain.  History sounds like vaso-vagal syncope following BM.  Will offer fluids and check orthostatics.  If patient remains well, okay for discharge with close PCP follow up.    10:00AM: Did well with fluids, orthostatics normal, no dizziness with ambulation.  Will d/c home with close follow up.  Return precautions reviewed.  Patient and family agreeable.        Phebe Colla, MD 10/12/12 1001

## 2012-10-12 NOTE — ED Notes (Signed)
Pt tolerated ginger ale and saltines well. No complaints of nausea.

## 2012-10-12 NOTE — ED Notes (Signed)
Pt ambulates from her room around Pod D back to her room. No assistance needed. Pt stated that she felt much better. Pt denies any CP/SOB.

## 2012-10-12 NOTE — ED Notes (Signed)
Patient is resting comfortably. 

## 2012-10-12 NOTE — ED Notes (Signed)
Pt given ginger ale, ice chips, and saltine crackers

## 2012-10-14 NOTE — ED Provider Notes (Signed)
I saw and evaluated the patient, reviewed the resident's note and I agree with the findings and plan. Pt with episode rectal bleeding. Rectal neg. Labs.   Suzi Roots, MD 10/14/12 [id number]

## 2012-12-15 ENCOUNTER — Ambulatory Visit (INDEPENDENT_AMBULATORY_CARE_PROVIDER_SITE_OTHER): Payer: BC Managed Care – PPO | Admitting: Cardiovascular Disease

## 2012-12-15 ENCOUNTER — Encounter: Payer: Self-pay | Admitting: Cardiovascular Disease

## 2012-12-15 VITALS — BP 118/62 | HR 60 | Ht 67.0 in | Wt 196.0 lb

## 2012-12-15 DIAGNOSIS — I10 Essential (primary) hypertension: Secondary | ICD-10-CM

## 2012-12-15 DIAGNOSIS — R55 Syncope and collapse: Secondary | ICD-10-CM | POA: Insufficient documentation

## 2012-12-15 NOTE — Progress Notes (Signed)
Connie James Date of Birth  [birth year]       [facility] Office [address]  [address] [place], [place]  [address]   [place], [place]  [address] [phone]     [phone]   Fax  [phone]    Fax [phone]  Problem List: 1. Syncope 2. Hypertension 3. GI bleed  History of Present Illness:  Ms. Connie James is a 67 yo who I have seen in the past ( 2009).    She passed out October 12, 2012.  She has had some GI bleeding.  She woke up with severe lower abdominal cramping.  She went to the bathroom and then called her husband to come help her to the couch.  She passed out on the way to the den.  She woke up a minute later with her husband tending to her.  She had severe nausea and felt very sick.    She is active - she is an Environmental health practitioner at [employer].   She has the 2 months of summer off. She walks regularly without problems.  She keeps her grandchildren occasionally.    Current Outpatient Prescriptions on File Prior to Visit  Medication Sig Dispense Refill  . buPROPion (WELLBUTRIN XL) 300 MG 24 hr tablet Take 300 mg by mouth daily.      Marland Kitchen diltiazem (TIAZAC) 300 MG 24 hr capsule Take 300 mg by mouth at bedtime.      Marland Kitchen lisinopril (PRINIVIL,ZESTRIL) 10 MG tablet Take 10 mg by mouth at bedtime.      . Magnesium Oxide (MAG-OX PO) Take 1 tablet by mouth daily.      . montelukast (SINGULAIR) 10 MG tablet Take 10 mg by mouth daily.       No current facility-administered medications on file prior to visit.    No Known Allergies  Past Medical History  Diagnosis Date  . Hypertension   . Bronchitis   . Heart murmur   . Seizures   . Hypoglycemia   . Constipation   . Poor circulation     in hands  . Colon polyp   . Hemorrhoids   . Wears dentures   . Contact lens/glasses fitting     Past Surgical History  Procedure Laterality Date  . Abdominal hysterectomy  01/1994  . Cholecystectomy  08/2002  .  Removal of tumor and ovary  11/2005    History  Smoking status  . Never Smoker   Smokeless tobacco  . Not on file    History  Alcohol Use No    Family History  Problem Relation Age of Onset  . Heart disease Mother   . Alzheimer's disease Mother   . Emphysema Father   . Lung disease Father   . Hypertension Brother     Reviw of Systems:  Reviewed in the HPI.  All other systems are negative.  Physical Exam: Blood pressure 118/62, pulse 60, height 5\' 7"  (1.702 m), weight 196 lb (88.905 kg), SpO2 99.00%. General: Well developed, well nourished, in no acute distress.  Head: Normocephalic, atraumatic, sclera non-icteric, mucus membranes are moist,   Neck: Supple. Carotids are 2 + without bruits. No JVD   Lungs: Clear   Heart: RR, normal S1, S2  Abdomen: Soft, non-tender, non-distended with normal bowel sounds.  Msk:  Strength and tone are normal   Extremities: No clubbing or cyanosis. No edema.  Distal pedal pulses are  2+ and equal    Neuro: CN II - XII intact.  Alert and oriented X 3.   Psych:  Normal   ECG: October 14, 2012, sinus brady 58.   Assessment / Plan:

## 2012-12-15 NOTE — Patient Instructions (Addendum)
Your physician recommends that you schedule a follow-up appointment in: follow up as needed.   Your physician recommends that you continue on your current medications as directed. Please refer to the Current Medication list given to you today.

## 2012-12-15 NOTE — Assessment & Plan Note (Signed)
MS. Connie James presents for further evaluation of a syncopal episode which I think is due to vasovagal response.  She had been having some GI problems and woke up with severe abdominal pain. She went to the bathroom and tried to get up to go to the couch. She felt very weak and lightheaded and then passed out. She was taken to the emergency room and workup was unremarkable.  By history this is entirely consistent with a vagal stimulation due to her abdominal pain. She has not had any further episodes of syncope over the subsequent 2 months. Her blood pressure and heart rate are well controlled.  At this point I don't think that she needs any further workup. If she has any further episodes of syncope that are not related to abdominal pain we will place an event monitor on her.  We will see her on an as-needed basis.

## 2013-01-30 ENCOUNTER — Emergency Department (HOSPITAL_COMMUNITY)
Admission: EM | Admit: 2013-01-30 | Discharge: 2013-01-30 | Disposition: A | Discharge status: Home / Self Care | Payer: BC Managed Care – PPO | Attending: Emergency Medicine | Admitting: Emergency Medicine

## 2013-01-30 ENCOUNTER — Emergency Department (HOSPITAL_COMMUNITY): Payer: BC Managed Care – PPO

## 2013-01-30 ENCOUNTER — Encounter (HOSPITAL_COMMUNITY): Payer: Self-pay | Admitting: Emergency Medicine

## 2013-01-30 DIAGNOSIS — Z79899 Other long term (current) drug therapy: Secondary | ICD-10-CM | POA: Insufficient documentation

## 2013-01-30 DIAGNOSIS — Z8719 Personal history of other diseases of the digestive system: Secondary | ICD-10-CM | POA: Insufficient documentation

## 2013-01-30 DIAGNOSIS — Z862 Personal history of diseases of the blood and blood-forming organs and certain disorders involving the immune mechanism: Secondary | ICD-10-CM | POA: Insufficient documentation

## 2013-01-30 DIAGNOSIS — Z8679 Personal history of other diseases of the circulatory system: Secondary | ICD-10-CM | POA: Insufficient documentation

## 2013-01-30 DIAGNOSIS — R109 Unspecified abdominal pain: Secondary | ICD-10-CM

## 2013-01-30 DIAGNOSIS — I10 Essential (primary) hypertension: Secondary | ICD-10-CM | POA: Insufficient documentation

## 2013-01-30 DIAGNOSIS — Z8639 Personal history of other endocrine, nutritional and metabolic disease: Secondary | ICD-10-CM | POA: Insufficient documentation

## 2013-01-30 DIAGNOSIS — R1011 Right upper quadrant pain: Secondary | ICD-10-CM | POA: Insufficient documentation

## 2013-01-30 DIAGNOSIS — Z98811 Dental restoration status: Secondary | ICD-10-CM | POA: Insufficient documentation

## 2013-01-30 DIAGNOSIS — Z860100 Personal history of colon polyps, unspecified: Secondary | ICD-10-CM | POA: Insufficient documentation

## 2013-01-30 DIAGNOSIS — R011 Cardiac murmur, unspecified: Secondary | ICD-10-CM | POA: Insufficient documentation

## 2013-01-30 DIAGNOSIS — Z8669 Personal history of other diseases of the nervous system and sense organs: Secondary | ICD-10-CM | POA: Insufficient documentation

## 2013-01-30 DIAGNOSIS — Z8709 Personal history of other diseases of the respiratory system: Secondary | ICD-10-CM | POA: Insufficient documentation

## 2013-01-30 LAB — POCT I-STAT TROPONIN I: Troponin i, poc: 0.3 ng/mL (ref 0.00–0.08)

## 2013-01-30 LAB — CBC WITH DIFFERENTIAL/PLATELET
Basophils Relative: 0 % (ref 0–1)
Eosinophils Absolute: 0.1 10*3/uL (ref 0.0–0.7)
Eosinophils Relative: 2 % (ref 0–5)
HCT: 39.1 % (ref 36.0–46.0)
Hemoglobin: 13.2 g/dL (ref 12.0–15.0)
MCH: 26.7 pg (ref 26.0–34.0)
MCHC: 33.8 g/dL (ref 30.0–36.0)
Monocytes Absolute: 0.5 10*3/uL (ref 0.1–1.0)
Monocytes Relative: 7 % (ref 3–12)

## 2013-01-30 LAB — COMPREHENSIVE METABOLIC PANEL
Albumin: 3.6 g/dL (ref 3.5–5.2)
BUN: 11 mg/dL (ref 6–23)
Creatinine, Ser: 0.81 mg/dL (ref 0.50–1.10)
Total Protein: 7.9 g/dL (ref 6.0–8.3)

## 2013-01-30 LAB — PROTIME-INR: INR: 0.9 (ref 0.00–1.49)

## 2013-01-30 LAB — LIPASE, BLOOD: Lipase: 22 U/L (ref 11–59)

## 2013-01-30 LAB — TYPE AND SCREEN: ABO/RH(D): O POS

## 2013-01-30 LAB — TROPONIN I: Troponin I: <0.3 ng/mL (ref <0.30)

## 2013-01-30 LAB — APTT: aPTT: 39 seconds — ABNORMAL HIGH (ref 24–37)

## 2013-01-30 MED ORDER — FENTANYL CITRATE 0.05 MG/ML IJ SOLN
50.0000 ug | Freq: Once | INTRAMUSCULAR | Status: AC
  Administered 2013-01-30: 50 ug via INTRAVENOUS
  Filled 2013-01-30: qty 2

## 2013-01-30 MED ORDER — ONDANSETRON HCL 4 MG/2ML IJ SOLN
4.0000 mg | Freq: Once | INTRAMUSCULAR | Status: AC
  Administered 2013-01-30: 4 mg via INTRAVENOUS
  Filled 2013-01-30: qty 2

## 2013-01-30 MED ORDER — IOHEXOL 300 MG/ML  SOLN
100.0000 mL | Freq: Once | INTRAMUSCULAR | Status: AC | PRN
  Administered 2013-01-30: 100 mL via INTRAVENOUS

## 2013-01-30 MED ORDER — SODIUM CHLORIDE 0.9 % IV BOLUS (SEPSIS)
1000.0000 mL | Freq: Once | INTRAVENOUS | Status: AC
  Administered 2013-01-30: 1000 mL via INTRAVENOUS

## 2013-01-30 MED ORDER — ASPIRIN 81 MG PO CHEW
324.0000 mg | CHEWABLE_TABLET | Freq: Once | ORAL | Status: AC
  Administered 2013-01-30: 324 mg via ORAL
  Filled 2013-01-30: qty 1

## 2013-01-30 MED ORDER — NITROGLYCERIN 0.4 MG SL SUBL
0.4000 mg | SUBLINGUAL_TABLET | SUBLINGUAL | Status: DC | PRN
  Administered 2013-01-30 (×2): 0.4 mg via SUBLINGUAL
  Filled 2013-01-30: qty 25

## 2013-01-30 MED ORDER — MORPHINE SULFATE 4 MG/ML IJ SOLN
4.0000 mg | Freq: Once | INTRAMUSCULAR | Status: AC
  Administered 2013-01-30: 4 mg via INTRAVENOUS
  Filled 2013-01-30: qty 1

## 2013-01-30 MED ORDER — FENTANYL CITRATE 0.05 MG/ML IJ SOLN
INTRAMUSCULAR | Status: AC
  Administered 2013-01-30: 50 ug
  Filled 2013-01-30: qty 2

## 2013-01-30 MED ORDER — IOHEXOL 300 MG/ML  SOLN
50.0000 mL | Freq: Once | INTRAMUSCULAR | Status: AC | PRN
  Administered 2013-01-30: 50 mL via ORAL

## 2013-01-30 NOTE — ED Notes (Addendum)
Pt resting comfortably - states pain is much improved, but she still has some pressure in RUQ.

## 2013-01-30 NOTE — ED Notes (Signed)
Pt resting comfortably.  She states pain/pressure are much better than previously.  Gillis Ends drew specimen for Troponin I.

## 2013-01-30 NOTE — ED Notes (Signed)
Pt c/o pain in RUQ x2 days with some nausea, but denies vomiting.  Pt states pain is intermittent and sharp upon occuring.

## 2013-01-30 NOTE — ED Provider Notes (Signed)
TIME SEEN: 7:26 AM  CHIEF COMPLAINT: RUQ abdominal pain, possible rectal bleeding  HPI: Pt is a 67 yo F with h/o HTN, seizures who presents to ED 3 days of right upper quadrant abdominal pain that is a pressure like pain, cramping. She denies any aggravating or alleviating factors. She is status post cholecystectomy. Denies any chest pain or shortness of breath. No cough. No nausea, vomiting or diarrhea. Patient did wake up in the middle of the night 3 days ago and had bright right blood in her bed that she seemed came for her rectum but she has not seen any rectal bleeding or vaginal bleeding since. Denies any melena.  ROS: See HPI Constitutional: no fever  Eyes: no drainage  ENT: no runny nose   Cardiovascular:  no chest pain  Resp: no SOB  GI: no vomiting GU: no dysuria Integumentary: no rash  Allergy: no hives  Musculoskeletal: no leg swelling  Neurological: no slurred speech ROS otherwise negative  PAST MEDICAL HISTORY/PAST SURGICAL HISTORY:  Past Medical History  Diagnosis Date  . Hypertension   . Bronchitis   . Heart murmur   . Seizures   . Hypoglycemia   . Constipation   . Poor circulation     in hands  . Colon polyp   . Hemorrhoids   . Wears dentures   . Contact lens/glasses fitting     MEDICATIONS:  Prior to Admission medications   Medication Sig Start Date End Date Taking? Authorizing Provider  buPROPion (WELLBUTRIN XL) 300 MG 24 hr tablet Take 300 mg by mouth every morning.    Yes Historical Provider, MD  diltiazem (DILACOR XR) 240 MG 24 hr capsule Take 240 mg by mouth every evening.   Yes Historical Provider, MD  fish oil-omega-3 fatty acids 1000 MG capsule Take 1 g by mouth 2 (two) times daily.    Yes Historical Provider, MD  lisinopril-hydrochlorothiazide (PRINZIDE,ZESTORETIC) 20-25 MG per tablet Take 1 tablet by mouth every evening.   Yes Historical Provider, MD  magnesium oxide (MAG-OX) 400 MG tablet Take 400 mg by mouth every evening.   Yes Historical  Provider, MD  montelukast (SINGULAIR) 10 MG tablet Take 10 mg by mouth daily as needed (allergies).    Yes Historical Provider, MD    ALLERGIES:  No Known Allergies  SOCIAL HISTORY:  History  Substance Use Topics  . Smoking status: Never Smoker   . Smokeless tobacco: Not on file  . Alcohol Use: No    FAMILY HISTORY: Family History  Problem Relation Age of Onset  . Heart disease Mother   . Alzheimer's disease Mother   . Emphysema Father   . Lung disease Father   . Hypertension Brother     EXAM: BP 90/65  Pulse 62  Temp(Src) 98.7 F (37.1 C) (Oral)  Resp 18  SpO2 100% CONSTITUTIONAL: Alert and oriented and responds appropriately to questions. Well-appearing; well-nourished, appears uncomfortable HEAD: Normocephalic EYES: Conjunctivae clear, PERRL ENT: normal nose; no rhinorrhea; moist mucous membranes; pharynx without lesions noted NECK: Supple, no meningismus, no LAD  CARD: RRR; S1 and S2 appreciated; no murmurs, no clicks, no rubs, no gallops RESP: Normal chest excursion without splinting or tachypnea; breath sounds clear and equal bilaterally; no wheezes, no rhonchi, no rales,  ABD/GI: Normal bowel sounds; non-distended; soft, tan to palpation in the right upper quadrant, no rebound, no guarding RECTAL:  Normal tone, no gross blood, no hemorrhoids BACK:  The back appears normal and is non-tender to palpation, there is  no CVA tenderness EXT: Normal ROM in all joints; non-tender to palpation; no edema; normal capillary refill; no cyanosis    SKIN: Normal color for age and race; warm NEURO: Moves all extremities equally PSYCH: The patient's mood and manner are appropriate. Grooming and personal hygiene are appropriate.  MEDICAL DECISION MAKING: Patient with right upper quadrant pain for 3 days and an episode of rectal bleeding 3 days ago. She is mildly hypotensive in triage but this spontaneously resolved. She reports taking her antihypertensives last night. Concern for  colitis, pancreatitis, peptic ulcer, ACS.  We'll order labs, EKG, CT abdomen pelvis. We'll begin fluid hydration and monitor pt closely.  ED PROGRESS:  8:25 AM  Pt with elevated i stat troponin, will add on formal troponin to labs. Will give aspirin, nitroglycerin. Her blood pressures have improved. She is currently receiving IV fluids. Will hold CT abdomen and pelvis at this time as pain may be cardiac in nature.  Pt states her last stress test was 2-3 years ago. No prior history of MI or cardiac catheterization.  No family history of premature cardiac disease. No history of diabetes or hyperlipidemia. No prior tobacco use. No pulmonary embolus risk factors other than age.  PCP is Conservator, museum/gallery is Lourena Simmonds   Date: 01/30/2013 7:42 AM  Rate: 54  Rhythm: Sinus bradycardia  QRS Axis: normal  Intervals: First degree AV block  ST/T Wave abnormalities: normal  Conduction Disutrbances: none  Narrative Interpretation: slight ST elevation in anterioseptal leads that is less than 1mm, does not meet STEMI criteria, unchanged compared to prior EKG October 12 2012   8:56 AM  Pt repeat troponin was negative. Chest x-ray shows no infiltrate. Will continue a CT abdomen and pelvis given troponin was negative and pain has been constant for the past 3-4 days and is reproducible with palpation of her abdomen. Patient no improvement with pain after nitroglycerin. Will give morphine now that blood pressure is stable. Family updated on plan.  10:29 AM  Called to room because patient was having increased pain with sitting upright and diaphoretic after receiving morphine. Repeat EKG shows no changes. She reports she is having right-sided upper abdominal pressure. No shortness of breath. Vital signs were within normal limits. Lungs are clear. Patient given fentanyl and symptoms improved. CT scan pending. Will repeat troponin.  Pulses equal in all 4 extremities.  Possible reaction to morphine.   Date: 01/30/2013  9:41am  Rate: 55  Rhythm: normal sinus rhythm  QRS Axis: normal  Intervals: First degree AV block  ST/T Wave abnormalities: normal  Conduction Disutrbances: none  Narrative Interpretation: First degree AV block, no ischemic changes, unchanged   10:52 AM  CT AP negative for acute pathology.  Labs unremarkable.  Second troponin pending. Patient feels much better.   1:05 PM  Second troponin negative.  Given patient denies chest pain or shortness of breath and her pain is in her right upper abdomen it is reproducible with palpation, I feel this is not cardiac in nature. Her EKG shows no changes in her cardiac enzymes x2 are negative. Unclear etiology for patient's pain today. I am not concerned for dissection as pt has no CP or back pain.  I am not concerned for PE as she has no CP, SOB, tachycardia, hypoxia.  Patient only had one isolated episode of hypotension that resolved spontaneously without intervention. Patient would like discharge home with outpatient followup. Have given her strict return precautions.  Patient and family verbalized understanding and are  comfortable with this plan.    Layla Maw Thaddeaus Monica, DO 01/30/13 1308

## 2013-01-30 NOTE — ED Notes (Signed)
Pt returned from CT, more comfortable after 2nd dose of Fentanyl.

## 2013-01-30 NOTE — ED Notes (Signed)
Pt states pain was relieved to 4/10 with Fentanyl, but she still has pressure in RUQ.

## 2013-01-30 NOTE — ED Notes (Signed)
Pt c/o RUQ pain onset 3 days ago, denies n/v/d.

## 2013-01-30 NOTE — ED Notes (Signed)
Dr. Elesa Massed notified of I stat Troponin of 0.30

## 2013-10-31 DIAGNOSIS — F419 Anxiety disorder, unspecified: Secondary | ICD-10-CM | POA: Insufficient documentation

## 2014-03-30 ENCOUNTER — Other Ambulatory Visit: Payer: Self-pay | Admitting: Obstetrics and Gynecology

## 2014-03-31 LAB — CYTOLOGY - PAP

## 2014-04-25 ENCOUNTER — Encounter: Payer: Self-pay | Admitting: Cardiovascular Disease

## 2014-04-25 ENCOUNTER — Ambulatory Visit (INDEPENDENT_AMBULATORY_CARE_PROVIDER_SITE_OTHER): Payer: BC Managed Care – PPO | Admitting: Cardiovascular Disease

## 2014-04-25 VITALS — BP 166/90 | HR 68 | Ht 67.0 in | Wt 198.8 lb

## 2014-04-25 DIAGNOSIS — R9431 Abnormal electrocardiogram [ECG] [EKG]: Secondary | ICD-10-CM

## 2014-04-25 DIAGNOSIS — I10 Essential (primary) hypertension: Secondary | ICD-10-CM

## 2014-04-25 NOTE — Progress Notes (Signed)
Connie James Date of Birth  07-14-1945       [facility] Office [address], [place], [place]  [address]   [place], [place]  [address] [phone]     [phone]   Fax  [phone]    Fax [phone]  Problem List: 1. Syncope 2. Hypertension 3. GI bleed  History of Present Illness:  Connie James is a 68 yo who I have seen in the past ( 2009).    She passed out October 12, 2012.  She has had some GI bleeding.  She woke up with severe lower abdominal cramping.  She went to the bathroom and then called her husband to come help her to the couch.  She passed out on the way to the den.  She woke up a minute later with her husband tending to her.  She had severe nausea and felt very sick.    She is active - she is an Web designer at [employer].   She has the 2 months of summer off. She walks regularly without problems.  She keeps her grandchildren occasionally.    Nov. 9, 2015:  Connie James was recently seen at an Urgent Care and was found to have a mildly abnormal ECG.  (Records not in our system) . Denies any recent symncope. Has some neck pain down into her left arm. Throbbing pain   Current Outpatient Prescriptions on File Prior to Visit  Medication Sig Dispense Refill  . buPROPion (WELLBUTRIN XL) 300 MG 24 hr tablet Take 300 mg by mouth every morning.     . diltiazem (DILACOR XR) 240 MG 24 hr capsule Take 240 mg by mouth every evening.    . fish oil-omega-3 fatty acids 1000 MG capsule Take 1 g by mouth 2 (two) times daily.     Marland Kitchen lisinopril-hydrochlorothiazide (PRINZIDE,ZESTORETIC) 20-25 MG per tablet Take 1 tablet by mouth every evening.    . montelukast (SINGULAIR) 10 MG tablet Take 10 mg by mouth daily as needed (allergies).      No current facility-administered medications on file prior to visit.    No Known Allergies  Past Medical History  Diagnosis Date  . Hypertension   .  Bronchitis   . Heart murmur   . Seizures   . Hypoglycemia   . Constipation   . Poor circulation     in hands  . Colon polyp   . Hemorrhoids   . Wears dentures   . Contact lens/glasses fitting     Past Surgical History  Procedure Laterality Date  . Abdominal hysterectomy  01/1994  . Cholecystectomy  08/2002  . Removal of tumor and ovary  11/2005    History  Smoking status  . Never Smoker   Smokeless tobacco  . Not on file    History  Alcohol Use No    Family History  Problem Relation Age of Onset  . Heart disease Mother   . Alzheimer's disease Mother   . Emphysema Father   . Lung disease Father   . Hypertension Brother     Reviw of Systems:  Reviewed in the HPI.  All other systems are negative.  Physical Exam: Blood pressure 166/90, pulse 68, height 5\' 7"  (1.702 m), weight 198 lb 12.8 oz (90.175 kg). General: Well developed, well nourished, in no acute distress.  Head: Normocephalic, atraumatic, sclera non-icteric, mucus membranes are moist,   Neck:  Supple. Carotids are 2 + without bruits. No JVD   Lungs: Clear   Heart: RR, normal S1, S2  Abdomen: Soft, non-tender, non-distended with normal bowel sounds.  Msk:  Strength and tone are normal   Extremities: No clubbing or cyanosis. No edema.  Distal pedal pulses are 2+ and equal    Neuro: CN II - XII intact.  Alert and oriented X 3.   Psych:  Normal   ECG: Nov. 9, 2015:  NSR at 53. [employer] RBBB, no signiciant changes from previous ecgs.  Assessment / Plan:

## 2014-04-25 NOTE — Patient Instructions (Signed)
Your physician recommends that you continue on your current medications as directed. Please refer to the Current Medication list given to you today.  Your physician recommends that you schedule a follow-up appointment in: as needed with Dr. Nahser  

## 2014-04-25 NOTE — Assessment & Plan Note (Signed)
BP is elevated.  Follow up with Dr. Teofilo Pod office. Advised salt restriction and regular exercise

## 2014-04-25 NOTE — Assessment & Plan Note (Signed)
Connie James presents today for evaluation of an abnormal EKG. Her EKG does show an incomplete right bundle branch block and right atrial enlargement. EKG is unchanged from her previous tracings. She wants to followup at [facility] check with Dr. Teofilo Pod office for referral.  I will see  her back on an as-needed basis.

## 2014-12-26 DIAGNOSIS — M3390 Dermatopolymyositis, unspecified, organ involvement unspecified: Secondary | ICD-10-CM | POA: Insufficient documentation

## 2015-01-19 DIAGNOSIS — R591 Generalized enlarged lymph nodes: Secondary | ICD-10-CM | POA: Insufficient documentation

## 2015-06-16 DIAGNOSIS — R319 Hematuria, unspecified: Secondary | ICD-10-CM | POA: Insufficient documentation

## 2015-06-16 DIAGNOSIS — R3989 Other symptoms and signs involving the genitourinary system: Secondary | ICD-10-CM | POA: Insufficient documentation

## 2015-06-30 DIAGNOSIS — N952 Postmenopausal atrophic vaginitis: Secondary | ICD-10-CM | POA: Diagnosis not present

## 2015-06-30 DIAGNOSIS — R319 Hematuria, unspecified: Secondary | ICD-10-CM | POA: Diagnosis not present

## 2015-07-17 DIAGNOSIS — Z79899 Other long term (current) drug therapy: Secondary | ICD-10-CM | POA: Diagnosis not present

## 2015-07-17 DIAGNOSIS — M3310 Other dermatopolymyositis, organ involvement unspecified: Secondary | ICD-10-CM | POA: Diagnosis not present

## 2015-09-25 DIAGNOSIS — K641 Second degree hemorrhoids: Secondary | ICD-10-CM | POA: Diagnosis not present

## 2015-09-25 DIAGNOSIS — R319 Hematuria, unspecified: Secondary | ICD-10-CM | POA: Diagnosis not present

## 2015-09-25 DIAGNOSIS — N952 Postmenopausal atrophic vaginitis: Secondary | ICD-10-CM | POA: Diagnosis not present

## 2015-11-20 DIAGNOSIS — M3390 Dermatopolymyositis, unspecified, organ involvement unspecified: Secondary | ICD-10-CM | POA: Diagnosis not present

## 2015-11-20 DIAGNOSIS — I7300 Raynaud's syndrome without gangrene: Secondary | ICD-10-CM | POA: Diagnosis not present

## 2015-11-21 DIAGNOSIS — K640 First degree hemorrhoids: Secondary | ICD-10-CM | POA: Diagnosis not present

## 2015-12-13 DIAGNOSIS — F329 Major depressive disorder, single episode, unspecified: Secondary | ICD-10-CM | POA: Diagnosis not present

## 2015-12-13 DIAGNOSIS — I10 Essential (primary) hypertension: Secondary | ICD-10-CM | POA: Diagnosis not present

## 2015-12-13 DIAGNOSIS — F419 Anxiety disorder, unspecified: Secondary | ICD-10-CM | POA: Diagnosis not present

## 2016-01-10 DIAGNOSIS — F419 Anxiety disorder, unspecified: Secondary | ICD-10-CM | POA: Diagnosis not present

## 2016-01-10 DIAGNOSIS — F329 Major depressive disorder, single episode, unspecified: Secondary | ICD-10-CM | POA: Diagnosis not present

## 2016-01-10 DIAGNOSIS — I10 Essential (primary) hypertension: Secondary | ICD-10-CM | POA: Diagnosis not present

## 2016-04-04 DIAGNOSIS — M3390 Dermatopolymyositis, unspecified, organ involvement unspecified: Secondary | ICD-10-CM | POA: Diagnosis not present

## 2016-04-04 DIAGNOSIS — M7632 Iliotibial band syndrome, left leg: Secondary | ICD-10-CM | POA: Diagnosis not present

## 2016-04-15 DIAGNOSIS — H3561 Retinal hemorrhage, right eye: Secondary | ICD-10-CM | POA: Diagnosis not present

## 2016-07-17 DIAGNOSIS — M3219 Other organ or system involvement in systemic lupus erythematosus: Secondary | ICD-10-CM | POA: Diagnosis not present

## 2016-07-17 DIAGNOSIS — M3390 Dermatopolymyositis, unspecified, organ involvement unspecified: Secondary | ICD-10-CM | POA: Diagnosis not present

## 2016-08-29 DIAGNOSIS — M329 Systemic lupus erythematosus, unspecified: Secondary | ICD-10-CM | POA: Diagnosis not present

## 2016-08-29 DIAGNOSIS — J302 Other seasonal allergic rhinitis: Secondary | ICD-10-CM | POA: Diagnosis not present

## 2016-08-29 DIAGNOSIS — F329 Major depressive disorder, single episode, unspecified: Secondary | ICD-10-CM | POA: Diagnosis not present

## 2016-08-29 DIAGNOSIS — K219 Gastro-esophageal reflux disease without esophagitis: Secondary | ICD-10-CM | POA: Diagnosis not present

## 2016-09-09 DIAGNOSIS — R443 Hallucinations, unspecified: Secondary | ICD-10-CM | POA: Diagnosis not present

## 2016-09-09 DIAGNOSIS — E784 Other hyperlipidemia: Secondary | ICD-10-CM | POA: Diagnosis not present

## 2016-09-09 DIAGNOSIS — Z048 Encounter for examination and observation for other specified reasons: Secondary | ICD-10-CM | POA: Diagnosis not present

## 2016-09-09 DIAGNOSIS — I10 Essential (primary) hypertension: Secondary | ICD-10-CM | POA: Diagnosis not present

## 2016-09-09 DIAGNOSIS — F22 Delusional disorders: Secondary | ICD-10-CM | POA: Diagnosis not present

## 2016-09-09 DIAGNOSIS — Z79899 Other long term (current) drug therapy: Secondary | ICD-10-CM | POA: Diagnosis not present

## 2016-09-09 DIAGNOSIS — F419 Anxiety disorder, unspecified: Secondary | ICD-10-CM | POA: Diagnosis not present

## 2016-09-16 ENCOUNTER — Encounter (HOSPITAL_COMMUNITY): Payer: Self-pay | Admitting: Emergency Medicine

## 2016-09-16 ENCOUNTER — Emergency Department (HOSPITAL_COMMUNITY)
Admission: EM | Admit: 2016-09-16 | Discharge: 2016-09-16 | Disposition: A | Discharge status: Home / Self Care | Payer: BC Managed Care – PPO | Attending: Emergency Medicine | Admitting: Emergency Medicine

## 2016-09-16 DIAGNOSIS — I10 Essential (primary) hypertension: Secondary | ICD-10-CM | POA: Diagnosis not present

## 2016-09-16 DIAGNOSIS — J309 Allergic rhinitis, unspecified: Secondary | ICD-10-CM | POA: Diagnosis not present

## 2016-09-16 DIAGNOSIS — H6981 Other specified disorders of Eustachian tube, right ear: Secondary | ICD-10-CM

## 2016-09-16 DIAGNOSIS — J3089 Other allergic rhinitis: Secondary | ICD-10-CM

## 2016-09-16 DIAGNOSIS — Z79899 Other long term (current) drug therapy: Secondary | ICD-10-CM | POA: Insufficient documentation

## 2016-09-16 DIAGNOSIS — H9201 Otalgia, right ear: Secondary | ICD-10-CM | POA: Diagnosis present

## 2016-09-16 DIAGNOSIS — J301 Allergic rhinitis due to pollen: Secondary | ICD-10-CM | POA: Diagnosis not present

## 2016-09-16 DIAGNOSIS — H6991 Unspecified Eustachian tube disorder, right ear: Secondary | ICD-10-CM | POA: Insufficient documentation

## 2016-09-16 MED ORDER — FLUTICASONE PROPIONATE 50 MCG/ACT NA SUSP: 2.0000 | Freq: Every day | NASAL | 1 refills | Status: DC

## 2016-09-16 MED ORDER — ACETAMINOPHEN 500 MG PO TABS
500.0000 mg | ORAL_TABLET | Freq: Four times a day (QID) | ORAL | 0 refills | Status: AC | PRN
Start: 2016-09-16 — End: ?

## 2016-09-16 NOTE — ED Triage Notes (Signed)
Pt c/o right ear pressure and intermittent pain x 1 week, pain interfering with sleep, pain/pressure radiating into right maxillary sinus. Taking Zyrtec without relief.

## 2016-09-16 NOTE — ED Provider Notes (Signed)
[facility] DEPT Provider Note   CSN: [id number] Arrival date & time: 09/16/16  1342  By signing my name below, I, Connie James, attest that this documentation has been prepared under the direction and in the presence of [name], PA-C. Electronically Signed: Sonum James, Education administrator. 09/16/16. 3:21 PM.  History   Chief Complaint Chief Complaint  Patient presents with  . Otalgia    The history is provided by the patient. No language interpreter was used.     HPI Comments: Connie James is a 71 y.o. female who presents to the Emergency Department complaining of right ear pressure with associated muffled hearing and rhinorrhea  that began last week. She states the pain is causing her to have trouble sleeping. She has tried Zyrtec without relief. She denies fever, left ear pain or hearing trouble, dizziness, sinus pressure.   Past Medical History:  Diagnosis Date  . Bronchitis   . Colon polyp   . Constipation   . Contact lens/glasses fitting   . Heart murmur   . Hemorrhoids   . Hypertension   . Hypoglycemia   . Poor circulation    in hands  . Seizures ([place])   . Wears dentures     Patient Active Problem List   Diagnosis Date Noted  . Abnormal ECG 04/25/2014  . Vaso vagal episode 12/15/2012  . HTN (hypertension) 12/15/2012    Past Surgical History:  Procedure Laterality Date  . ABDOMINAL HYSTERECTOMY  01/1994  . CHOLECYSTECTOMY  08/2002  . removal of tumor and ovary  11/2005    OB History    No data available       Home Medications    Prior to Admission medications   Medication Sig Start Date End Date Taking? Authorizing Provider  acetaminophen (TYLENOL) 500 MG tablet Take 1 tablet (500 mg total) by mouth every 6 (six) hours as needed for mild pain or moderate pain. 09/16/16   [name], PA-C  buPROPion (WELLBUTRIN XL) 300 MG 24 hr tablet Take 300 mg by mouth every morning.     Historical Provider, MD  cetirizine (ZYRTEC) 10 MG tablet Take 10 mg by mouth.     Historical Provider, MD  diltiazem (DILACOR XR) 240 MG 24 hr capsule Take 240 mg by mouth every evening.    Historical Provider, MD  fish oil-omega-3 fatty acids 1000 MG capsule Take 1 g by mouth 2 (two) times daily.     Historical Provider, MD  fluticasone (FLONASE) 50 MCG/ACT nasal spray Place 2 sprays into both nostrils daily. 09/16/16   [name], PA-C  lisinopril-hydrochlorothiazide (PRINZIDE,ZESTORETIC) 20-25 MG per tablet Take 1 tablet by mouth every evening.    Historical Provider, MD  Magnesium 500 MG CAPS Take 500 mg by mouth.    Historical Provider, MD  montelukast (SINGULAIR) 10 MG tablet Take 10 mg by mouth daily as needed (allergies).     Historical Provider, MD    Family History Family History  Problem Relation Age of Onset  . Heart disease Mother   . Alzheimer's disease Mother   . Emphysema Father   . Lung disease Father   . Hypertension Brother     Social History Social History  Substance Use Topics  . Smoking status: Never Smoker  . Smokeless tobacco: Not on file  . Alcohol use No     Allergies   Patient has no known allergies.   Review of Systems Review of Systems  Constitutional: Negative for fever.  HENT: Positive for congestion,  ear pain, hearing loss, postnasal drip and rhinorrhea. Negative for sinus pain, sinus pressure, sore throat and trouble swallowing.   Musculoskeletal: Negative for neck pain.  Skin: Negative for rash.  Neurological: Negative for dizziness, light-headedness and headaches.     Physical Exam Updated Vital Signs BP (!) 145/72 (BP Location: Left Arm)   Pulse 68   Temp 98.2 F (36.8 C) (Oral)   Resp 16   SpO2 100%   Physical Exam  Constitutional: She appears well-developed and well-nourished. No distress.  Nontoxic appearing.  HENT:  Head: Normocephalic and atraumatic.  Right Ear: External ear normal. Tympanic membrane is not erythematous. A middle ear effusion is present.  Left Ear: External ear normal. Tympanic  membrane is not erythematous. A middle ear effusion is present.  Mouth/Throat: Oropharynx is clear and moist. No oropharyngeal exudate, posterior oropharyngeal edema or posterior oropharyngeal erythema.  Left ear has mild clear middle ear effusion without TM erythema or loss of landmarks. Right ear has moderate clear middle ear effusion without TM erythema. Boggy nasal turbinates. Posterior oropharynx is clear but there is evidence of postnasal drip. No mastoid TTP bilaterally. No EAC edema or erythema.    Eyes: Conjunctivae are normal. Pupils are equal, round, and reactive to light. Right eye exhibits no discharge. Left eye exhibits no discharge.  Neck: Neck supple.  Cardiovascular: Normal rate, regular rhythm, normal heart sounds and intact distal pulses.   Pulmonary/Chest: Effort normal and breath sounds normal. No respiratory distress.  Abdominal: Soft. There is no tenderness.  Lymphadenopathy:    She has no cervical adenopathy.  Neurological: She is alert. Coordination normal.  Skin: Skin is warm and dry. No rash noted. She is not diaphoretic. No erythema. No pallor.  Psychiatric: She has a normal mood and affect. Her behavior is normal.  Nursing note and vitals reviewed.    ED Treatments / Results  DIAGNOSTIC STUDIES: Oxygen Saturation is 100% on RA, normal by my interpretation.    COORDINATION OF CARE: 3:19 PM Discussed treatment plan with pt at bedside and pt agreed to plan.   Labs (all labs ordered are listed, but only abnormal results are displayed) Labs Reviewed - No data to display  EKG  EKG Interpretation None       Radiology No results found.  Procedures Procedures (including critical care time)  Medications Ordered in ED Medications - No data to display   Initial Impression / Assessment and Plan / ED Course  I have reviewed the triage vital signs and the nursing notes.  Pertinent labs & imaging results that were available during my care of the patient  were reviewed by me and considered in my medical decision making (see chart for details).     Patient presented with right ear pain, pressure and decreased hearing for about a week and a half. She also reports associated popping sensation. On exam the patient is afebrile nontoxic appearing. She is rhinorrhea present. Boggy nasal turbinates bilaterally present. She is evidence of eustachian tube dysfunction with clear middle ear effusion noted bilaterally that is worse on her right side. No external auditory canal edema or erythema. No mastoid tenderness. Will start on Flonase and have her continue using Zyrtec for her symptoms. Follow-up with PCP. I advised the patient to follow-up with their primary care provider this week. I advised the patient to return to the emergency department with new or worsening symptoms or new concerns. The patient verbalized understanding and agreement with plan.      Final  Clinical Impressions(s) / ED Diagnoses   Final diagnoses:  Dysfunction of right eustachian tube  Acute allergic rhinitis due to other allergen, unspecified seasonality    New Prescriptions New Prescriptions   ACETAMINOPHEN (TYLENOL) 500 MG TABLET    Take 1 tablet (500 mg total) by mouth every 6 (six) hours as needed for mild pain or moderate pain.   FLUTICASONE (FLONASE) 50 MCG/ACT NASAL SPRAY    Place 2 sprays into both nostrils daily.   I personally performed the services described in this documentation, which was scribed in my presence. The recorded information has been reviewed and is accurate.      [name], PA-C 09/16/16 [place], MD 09/18/16 [phone]

## 2016-10-01 DIAGNOSIS — N644 Mastodynia: Secondary | ICD-10-CM | POA: Diagnosis not present

## 2016-10-02 ENCOUNTER — Other Ambulatory Visit: Payer: Self-pay | Admitting: Obstetrics and Gynecology

## 2016-10-02 DIAGNOSIS — N644 Mastodynia: Secondary | ICD-10-CM

## 2016-10-09 ENCOUNTER — Ambulatory Visit
Admission: RE | Admit: 2016-10-09 | Discharge: 2016-10-09 | Disposition: A | Discharge status: Home / Self Care | Payer: BC Managed Care – PPO | Source: Ambulatory Visit | Attending: Obstetrics and Gynecology | Admitting: Obstetrics and Gynecology

## 2016-10-09 DIAGNOSIS — N644 Mastodynia: Secondary | ICD-10-CM

## 2016-10-17 DIAGNOSIS — M2012 Hallux valgus (acquired), left foot: Secondary | ICD-10-CM | POA: Diagnosis not present

## 2016-10-17 DIAGNOSIS — M359 Systemic involvement of connective tissue, unspecified: Secondary | ICD-10-CM | POA: Diagnosis not present

## 2016-10-17 DIAGNOSIS — I10 Essential (primary) hypertension: Secondary | ICD-10-CM | POA: Diagnosis not present

## 2016-10-17 DIAGNOSIS — L930 Discoid lupus erythematosus: Secondary | ICD-10-CM | POA: Diagnosis not present

## 2016-10-17 DIAGNOSIS — M329 Systemic lupus erythematosus, unspecified: Secondary | ICD-10-CM | POA: Diagnosis not present

## 2016-10-17 DIAGNOSIS — M779 Enthesopathy, unspecified: Secondary | ICD-10-CM | POA: Diagnosis not present

## 2016-10-29 DIAGNOSIS — N63 Unspecified lump in unspecified breast: Secondary | ICD-10-CM | POA: Diagnosis not present

## 2017-06-18 DIAGNOSIS — Z01419 Encounter for gynecological examination (general) (routine) without abnormal findings: Secondary | ICD-10-CM | POA: Diagnosis not present

## 2017-06-18 DIAGNOSIS — Z1231 Encounter for screening mammogram for malignant neoplasm of breast: Secondary | ICD-10-CM | POA: Diagnosis not present

## 2017-06-18 DIAGNOSIS — Z6830 Body mass index (BMI) 30.0-30.9, adult: Secondary | ICD-10-CM | POA: Diagnosis not present

## 2017-06-19 ENCOUNTER — Other Ambulatory Visit: Payer: Self-pay | Admitting: Obstetrics and Gynecology

## 2017-06-19 DIAGNOSIS — R1030 Lower abdominal pain, unspecified: Secondary | ICD-10-CM

## 2017-07-01 ENCOUNTER — Ambulatory Visit
Admission: RE | Admit: 2017-07-01 | Discharge: 2017-07-01 | Disposition: A | Discharge status: Home / Self Care | Payer: BC Managed Care – PPO | Source: Ambulatory Visit | Attending: Obstetrics and Gynecology | Admitting: Obstetrics and Gynecology

## 2017-07-01 DIAGNOSIS — K4090 Unilateral inguinal hernia, without obstruction or gangrene, not specified as recurrent: Secondary | ICD-10-CM | POA: Diagnosis not present

## 2017-07-01 DIAGNOSIS — R1030 Lower abdominal pain, unspecified: Secondary | ICD-10-CM

## 2017-07-01 MED ORDER — IOPAMIDOL (ISOVUE-300) INJECTION 61%
100.0000 mL | Freq: Once | INTRAVENOUS | Status: AC | PRN
  Administered 2017-07-01: 100 mL via INTRAVENOUS

## 2017-08-22 ENCOUNTER — Emergency Department (HOSPITAL_COMMUNITY): Payer: BC Managed Care – PPO

## 2017-08-22 ENCOUNTER — Other Ambulatory Visit: Payer: Self-pay

## 2017-08-22 ENCOUNTER — Encounter (HOSPITAL_COMMUNITY): Payer: Self-pay

## 2017-08-22 ENCOUNTER — Emergency Department (HOSPITAL_COMMUNITY)
Admission: EM | Admit: 2017-08-22 | Discharge: 2017-08-23 | Disposition: A | Discharge status: Home / Self Care | Payer: BC Managed Care – PPO | Attending: Emergency Medicine | Admitting: Emergency Medicine

## 2017-08-22 DIAGNOSIS — R202 Paresthesia of skin: Secondary | ICD-10-CM | POA: Diagnosis not present

## 2017-08-22 DIAGNOSIS — R0789 Other chest pain: Secondary | ICD-10-CM

## 2017-08-22 DIAGNOSIS — Z7982 Long term (current) use of aspirin: Secondary | ICD-10-CM | POA: Diagnosis not present

## 2017-08-22 DIAGNOSIS — R079 Chest pain, unspecified: Secondary | ICD-10-CM | POA: Diagnosis not present

## 2017-08-22 DIAGNOSIS — K5730 Diverticulosis of large intestine without perforation or abscess without bleeding: Secondary | ICD-10-CM | POA: Diagnosis not present

## 2017-08-22 DIAGNOSIS — I10 Essential (primary) hypertension: Secondary | ICD-10-CM | POA: Insufficient documentation

## 2017-08-22 DIAGNOSIS — R0602 Shortness of breath: Secondary | ICD-10-CM | POA: Diagnosis not present

## 2017-08-22 LAB — HEPATIC FUNCTION PANEL
ALT: 13 U/L — AB (ref 14–54)
AST: 20 U/L (ref 15–41)
Albumin: 4 g/dL (ref 3.5–5.0)
Alkaline Phosphatase: 90 U/L (ref 38–126)
BILIRUBIN DIRECT: 0.1 mg/dL (ref 0.1–0.5)
BILIRUBIN INDIRECT: 0.4 mg/dL (ref 0.3–0.9)
BILIRUBIN TOTAL: 0.5 mg/dL (ref 0.3–1.2)
Total Protein: 8 g/dL (ref 6.5–8.1)

## 2017-08-22 LAB — BASIC METABOLIC PANEL
ANION GAP: 9 (ref 5–15)
BUN: 7 mg/dL (ref 6–20)
CO2: 27 mmol/L (ref 22–32)
Calcium: 9.6 mg/dL (ref 8.9–10.3)
Chloride: 99 mmol/L — ABNORMAL LOW (ref 101–111)
Creatinine, Ser: 0.69 mg/dL (ref 0.44–1.00)
GFR calc Af Amer: >60 mL/min (ref >60)
GFR calc non Af Amer: >60 mL/min (ref >60)
GLUCOSE: 85 mg/dL (ref 65–99)
Potassium: 3.6 mmol/L (ref 3.5–5.1)
Sodium: 135 mmol/L (ref 135–145)

## 2017-08-22 LAB — I-STAT TROPONIN, ED
Troponin i, poc: 0 ng/mL (ref 0.00–0.08)
Troponin i, poc: 0 ng/mL (ref 0.00–0.08)

## 2017-08-22 LAB — CBC
HEMATOCRIT: 40.4 % (ref 36.0–46.0)
HEMOGLOBIN: 13.6 g/dL (ref 12.0–15.0)
MCH: 28.1 pg (ref 26.0–34.0)
MCHC: 33.7 g/dL (ref 30.0–36.0)
MCV: 83.5 fL (ref 78.0–100.0)
Platelets: 292 10*3/uL (ref 150–400)
RBC: 4.84 MIL/uL (ref 3.87–5.11)
RDW: 14.1 % (ref 11.5–15.5)
WBC: 8.2 10*3/uL (ref 4.0–10.5)

## 2017-08-22 MED ORDER — IOPAMIDOL (ISOVUE-370) INJECTION 76%
INTRAVENOUS | Status: AC
  Administered 2017-08-22: 100 mL
  Filled 2017-08-22: qty 100

## 2017-08-22 MED ORDER — METOPROLOL TARTRATE 5 MG/5ML IV SOLN
5.0000 mg | Freq: Once | INTRAVENOUS | Status: AC
  Administered 2017-08-22: 5 mg via INTRAVENOUS
  Filled 2017-08-22: qty 5

## 2017-08-22 MED ORDER — SODIUM CHLORIDE 0.9 % IJ SOLN
INTRAMUSCULAR | Status: AC
  Administered 2017-08-22: 10 mL
  Filled 2017-08-22: qty 50

## 2017-08-22 NOTE — ED Provider Notes (Signed)
[facility] DEPT Provider Note   CSN: [id number] Arrival date & time: 08/22/17  1746     History   Chief Complaint Chief Complaint  Patient presents with  . Chest Pain  . Numbness    HPI Connie James is a 72 y.o. female.  Patient states that she has been experience some occasional chest discomfort, arm discomfort and numbness in her hands.  She also says that her hands can return bluish.   The history is provided by the patient.  Chest Pain   This is a new problem. The current episode started more than 2 days ago. The problem occurs rarely. The problem has been resolved. Associated with: Unknown. The pain is present in the substernal region. The pain is at a severity of 4/10. The pain is moderate. The quality of the pain is described as brief. Pertinent negatives include no abdominal pain, no back pain, no cough and no headaches.  Pertinent negatives for past medical history include no seizures.    Past Medical History:  Diagnosis Date  . Bronchitis   . Colon polyp   . Constipation   . Contact lens/glasses fitting   . Heart murmur   . Hemorrhoids   . Hypertension   . Hypoglycemia   . Poor circulation    in hands  . Wears dentures     Patient Active Problem List   Diagnosis Date Noted  . Abnormal ECG 04/25/2014  . Vaso vagal episode 12/15/2012  . HTN (hypertension) 12/15/2012    Past Surgical History:  Procedure Laterality Date  . ABDOMINAL HYSTERECTOMY  01/1994  . CHOLECYSTECTOMY  08/2002  . removal of tumor and ovary  11/2005    OB History    No data available       Home Medications    Prior to Admission medications   Medication Sig Start Date End Date Taking? Authorizing Provider  aspirin EC 81 MG tablet Take 81 mg by mouth daily.   Yes [provider]  Cholecalciferol (VITAMIN D PO) Take 1 tablet by mouth daily.   Yes [provider]  diltiazem (TIAZAC) 120 MG 24 hr capsule Take 120 mg by mouth 2  (two) times daily. 08/07/17  Yes [provider]  fluticasone (FLONASE) 50 MCG/ACT nasal spray Place 2 sprays into both nostrils daily. 09/16/16  Yes [name], PA-C  lisinopril-hydrochlorothiazide (PRINZIDE,ZESTORETIC) 20-25 MG per tablet Take 1 tablet by mouth every evening.   Yes [provider]  Magnesium 500 MG CAPS Take 500 mg by mouth.   Yes [provider]  acetaminophen (TYLENOL) 500 MG tablet Take 1 tablet (500 mg total) by mouth every 6 (six) hours as needed for mild pain or moderate pain. Patient not taking: Reported on 08/22/2017 09/16/16   [name], PA-C  buPROPion (WELLBUTRIN XL) 300 MG 24 hr tablet Take 300 mg by mouth every morning.     [provider]    Family History Family History  Problem Relation Age of Onset  . Heart disease Mother   . Alzheimer's disease Mother   . Emphysema Father   . Lung disease Father   . Hypertension Brother     Social History Social History   Tobacco Use  . Smoking status: Never Smoker  . Smokeless tobacco: Never Used  Substance Use Topics  . Alcohol use: No  . Drug use: No     Allergies   Patient has no known allergies.   Review of Systems Review  of Systems  Constitutional: Negative for appetite change and fatigue.  HENT: Negative for congestion, ear discharge and sinus pressure.   Eyes: Negative for discharge.  Respiratory: Negative for cough.   Cardiovascular: Positive for chest pain.  Gastrointestinal: Negative for abdominal pain and diarrhea.  Genitourinary: Negative for frequency and hematuria.  Musculoskeletal: Negative for back pain.       Discoloration of hands  Skin: Negative for rash.  Neurological: Negative for seizures and headaches.  Psychiatric/Behavioral: Negative for hallucinations.     Physical Exam Updated Vital Signs BP (!) 193/75   Pulse 69   Temp 99 F (37.2 C) (Oral)   Resp 18   Ht 5\' 7"  (1.702 m)   Wt 88 kg (194 lb)   SpO2 97%   BMI 30.38  kg/m   Physical Exam  Constitutional: She is oriented to person, place, and time. She appears well-developed.  HENT:  Head: Normocephalic.  Eyes: Conjunctivae and EOM are normal. No scleral icterus.  Neck: Neck supple. No thyromegaly present.  Cardiovascular: Normal rate and regular rhythm. Exam reveals no gallop and no friction rub.  No murmur heard. Pulmonary/Chest: No stridor. She has no wheezes. She has no rales. She exhibits no tenderness.  Abdominal: She exhibits no distension. There is no tenderness. There is no rebound.  Musculoskeletal: Normal range of motion. She exhibits no edema.  Lymphadenopathy:    She has no cervical adenopathy.  Neurological: She is oriented to person, place, and time. She exhibits normal muscle tone. Coordination normal.  Skin: No rash noted. No erythema.  Psychiatric: She has a normal mood and affect. Her behavior is normal.     ED Treatments / Results  Labs (all labs ordered are listed, but only abnormal results are displayed) Labs Reviewed  BASIC METABOLIC PANEL - Abnormal; Notable for the following components:      Result Value   Chloride 99 (*)    All other components within normal limits  HEPATIC FUNCTION PANEL - Abnormal; Notable for the following components:   ALT 13 (*)    All other components within normal limits  CBC  I-STAT TROPONIN, ED  I-STAT TROPONIN, ED    EKG  EKG Interpretation  Date/Time:  Friday August 22 2017 19:12:18 EST Ventricular Rate:  64 PR Interval:    QRS Duration: 100 QT Interval:  434 QTC Calculation: 448 R Axis:   -55 Text Interpretation:  Sinus or ectopic atrial rhythm Inferior infarct, old Baseline wander in lead(s) V1 V2 Confirmed by Milton Ferguson [phone]) on 08/22/2017 9:19:44 PM       Radiology Dg Chest 2 View  Result Date: 08/22/2017 CLINICAL DATA:  Acute chest pain. EXAM: CHEST - 2 VIEW COMPARISON:  01/30/2013 and prior radiographs FINDINGS: The cardiomediastinal silhouette is unremarkable.  There is no evidence of focal airspace disease, pulmonary edema, suspicious pulmonary nodule/mass, pleural effusion, or pneumothorax. No acute bony abnormalities are identified. IMPRESSION: No active cardiopulmonary disease. Electronically Signed   By: Margarette Canada M.D.   On: 08/22/2017 19:51   Ct Angio Chest/abd/pel For Dissection W And/or Wo Contrast  Result Date: 08/22/2017 CLINICAL DATA:  Intermittent chest pain and shortness of breath with discoloration and numbness of the fingers EXAM: CT ANGIOGRAPHY CHEST, ABDOMEN AND PELVIS TECHNIQUE: Multidetector CT imaging through the chest, abdomen and pelvis was performed using the standard protocol during bolus administration of intravenous contrast. Multiplanar reconstructed images and MIPs were obtained and reviewed to evaluate the vascular anatomy. CONTRAST:  116mL ISOVUE-370 IOPAMIDOL (ISOVUE-370) INJECTION  76% COMPARISON:  CT abdomen pelvis 07/01/2017, 01/30/2013 FINDINGS: CTA CHEST FINDINGS Cardiovascular: Non contrasted images of the chest demonstrate no intramural hematoma. Nonaneurysmal aorta. No dissection seen. Mild atherosclerotic calcification. Normal heart size. No pericardial effusion. Mediastinum/Nodes: Midline trachea. No thyroid mass. Esophagus within normal limits. No significantly enlarged mediastinal lymph nodes. Mildly prominent left greater than right axillary lymph nodes with lymph nodes on the left measuring up to 13 mm. Lungs/Pleura: Mild apical scarring. 6 x 5 mm, 6 mm average diameter nodule in the left upper lobe, series 6, image number 37. Additional small ground-glass nodule measuring 4 mm in the left upper lobe, series 6, image number 61. 5 mm right middle lobe anterior lung nodule, series 6 image number 89. Mild tree-in-bud density at the bilateral lung bases. Musculoskeletal: No acute or suspicious lesion. Review of the MIP images confirms the above findings. CTA ABDOMEN AND PELVIS FINDINGS VASCULAR Aorta: Nonaneurysmal aorta. No  dissection. Mild atherosclerotic calcifications. 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 Mild focal ectasia at this level up to 2.5 cm. Celiac: Patent without evidence of aneurysm, dissection, vasculitis or significant stenosis. SMA: Patent without evidence of aneurysm, dissection, vasculitis or significant stenosis. Renals: Single right and single left renal arteries which are patent and without significant disease. IMA: Possible narrowing at the origin but with distal vessel patency. Inflow: Mild atherosclerotic calcifications without flow limiting stenosis. Veins: No obvious venous abnormality within the limitations of this arterial phase study. Review of the MIP images confirms the above findings. NON-VASCULAR Hepatobiliary: No focal liver abnormality is seen. Status post cholecystectomy. No biliary dilatation. Pancreas: Unremarkable. No pancreatic ductal dilatation or surrounding inflammatory changes. Spleen: Normal in size without focal abnormality. Adrenals/Urinary Tract: Right adrenal gland is normal. 18 mm left adrenal gland nodule, stable and likely representing an adenoma. No hydronephrosis. The bladder is within normal limits. Stomach/Bowel: Stomach is within normal limits. Appendix appears normal. No evidence of bowel wall thickening, distention, or inflammatory changes. Sigmoid colon diverticula without acute inflammation Lymphatic: No significantly enlarged lymph nodes Reproductive: Negative Other: Negative for free air or free fluid. Small fat in the umbilical region. Fat containing right inguinal hernia. Musculoskeletal: No acute or significant osseous findings. Review of the MIP images confirms the above findings. IMPRESSION: 1. Negative for acute aortic dissection or aneurysm 2. 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 No surrounding hematoma to suggest leaking. 3. Mild tree-in-bud  density at the bilateral lung bases consistent with endobronchial infection. Scattered pulmonary nodules measuring up to 6 mm in the left upper lobe. Non-contrast chest CT at 6-12 months is recommended. If the nodule is stable at time of repeat CT, then future CT at 18-24 months (from today's scan) is considered optional for low-risk patients, but is recommended for high-risk patients. This recommendation follows the consensus statement: Guidelines for Management of Incidental Pulmonary Nodules Detected on CT Images: From the Fleischner Society 2017; Radiology 2017; 284:228-243. 4. Mild left axillary adenopathy. 5. Stable left adrenal likely adenoma. 6. Sigmoid colon diverticular disease without acute inflammation. Electronically Signed   By: Donavan Foil M.D.   On: 08/22/2017 23:25    Procedures Procedures (including critical care time)  Medications Ordered in ED Medications  metoprolol tartrate (LOPRESSOR) injection 5 mg (5 mg Intravenous Given 08/22/17 2237)  sodium chloride 0.9 % injection (10 mLs  Given 08/22/17 2240)  iopamidol (ISOVUE-370) 76 % injection (100 mLs  Contrast Given 08/22/17 2249)     Initial Impression / Assessment and Plan / ED Course  I have reviewed the triage vital signs and the nursing notes.  Pertinent labs & imaging results that were available during my care of the patient were reviewed by me and considered in my medical decision making (see chart for details).    Patient with chest pain.  Labs including CBC chemistries troponin x2 are negative.  CT of chest and abdomen shows a infrarenal abdominal aorta ulcer with some ectasia.  I spoke with the vascular surgeon Dr. early and he reviewed the films.  He stated that there is nothing to do about this right now and his office will contact the patient to have her follow-up with him   Final Clinical Impressions(s) / ED Diagnoses   Final diagnoses:  None    ED Discharge Orders    None       Milton Ferguson,  MD 08/22/17 2356

## 2017-08-22 NOTE — ED Notes (Signed)
Pt having cramping in chest and abd with cyniosis  Bilateral hands and cold finger tips.

## 2017-08-22 NOTE — ED Triage Notes (Signed)
Patient reports that she has been having intermittent chest pain and SOB  X 3 days. Patient reports that she has noted that she has discoloration and numbness of her fingers. Patient does not have blue /purple fingers in triage.

## 2017-08-23 MED ORDER — PANTOPRAZOLE SODIUM 20 MG PO TBEC: 20.0000 mg | DELAYED_RELEASE_TABLET | Freq: Every day | ORAL | 0 refills | Status: AC

## 2017-08-23 NOTE — Discharge Instructions (Signed)
Follow-up with your family doctor in a week for recheck.  Dr. Luther Parody office will contact you and make an appointment to discuss the findings on CT scan.  He did state that there would be any treatment but he wanted to speak with you

## 2017-08-25 ENCOUNTER — Telehealth: Payer: Self-pay | Admitting: Vascular Surgery

## 2017-08-25 DIAGNOSIS — Z6830 Body mass index (BMI) 30.0-30.9, adult: Secondary | ICD-10-CM | POA: Diagnosis not present

## 2017-08-25 DIAGNOSIS — J309 Allergic rhinitis, unspecified: Secondary | ICD-10-CM | POA: Diagnosis not present

## 2017-08-25 DIAGNOSIS — H6691 Otitis media, unspecified, right ear: Secondary | ICD-10-CM | POA: Diagnosis not present

## 2017-08-25 DIAGNOSIS — J069 Acute upper respiratory infection, unspecified: Secondary | ICD-10-CM | POA: Diagnosis not present

## 2017-08-25 DIAGNOSIS — I10 Essential (primary) hypertension: Secondary | ICD-10-CM | POA: Diagnosis not present

## 2017-08-25 NOTE — Telephone Encounter (Signed)
-----   Message from Mena Goes, RN sent at 08/23/2017 10:19 PM EST ----- Regarding: Office appt w/ [facility] to discuss CT   ----- Message ----- From: Rosetta Posner, MD Sent: 08/23/2017   8:01 AM To: [facility], [facility] Charge [facility]  This patient needs an office visit with me within the next several weeks.  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  I did not see her in the ER.  She needs to see me for discussion of this in the office

## 2017-08-25 NOTE — Telephone Encounter (Signed)
Spoke w/pt re 09/23/17 appt.

## 2017-09-11 ENCOUNTER — Ambulatory Visit (INDEPENDENT_AMBULATORY_CARE_PROVIDER_SITE_OTHER): Payer: BC Managed Care – PPO | Admitting: Podiatry

## 2017-09-11 ENCOUNTER — Encounter: Payer: Self-pay | Admitting: Podiatry

## 2017-09-11 DIAGNOSIS — M79609 Pain in unspecified limb: Secondary | ICD-10-CM

## 2017-09-11 DIAGNOSIS — F329 Major depressive disorder, single episode, unspecified: Secondary | ICD-10-CM | POA: Insufficient documentation

## 2017-09-11 DIAGNOSIS — Z9109 Other allergy status, other than to drugs and biological substances: Secondary | ICD-10-CM | POA: Insufficient documentation

## 2017-09-11 DIAGNOSIS — B351 Tinea unguium: Secondary | ICD-10-CM

## 2017-09-11 DIAGNOSIS — F32A Depression, unspecified: Secondary | ICD-10-CM | POA: Insufficient documentation

## 2017-09-11 NOTE — Progress Notes (Signed)
  Subjective:  Patient ID: Connie James, female    DOB: Aug 22, 1945,  MRN: [id number]  Chief Complaint  Patient presents with  . Nail Problem    bilateral 5th toenails are painful - also right great toenail might be ingrown   72 y.o. female presents with the above complaint.  Reports bilateral fifth toenail pain.  States that she has bunions to both feet states the right has been fixed surgically and had a terrible experience and is not but it resolved.  Reports that pinky toenails are thick and very painful and the right great toenails possibly ingrown.  Denies nausea vomiting fever chills  Past Medical History:  Diagnosis Date  . Bronchitis   . Colon polyp   . Constipation   . Contact lens/glasses fitting   . Heart murmur   . Hemorrhoids   . Hypertension   . Hypoglycemia   . Poor circulation    in hands  . Wears dentures    Past Surgical History:  Procedure Laterality Date  . ABDOMINAL HYSTERECTOMY  01/1994  . CHOLECYSTECTOMY  08/2002  . removal of tumor and ovary  11/2005    Current Outpatient Medications:  .  acetaminophen (TYLENOL) 500 MG tablet, Take 1 tablet (500 mg total) by mouth every 6 (six) hours as needed for mild pain or moderate pain. (Patient not taking: Reported on 08/22/2017), Disp: 30 tablet, Rfl: 0 .  amoxicillin-clavulanate (AUGMENTIN) 875-125 MG tablet, TAKE 1 TABLET (875 MG) BY MOUTH 2 TIMES PER DAY FOR 10 DAYS, Disp: , Rfl: 0 .  azelastine (ASTELIN) 0.1 % nasal spray, SPRAY 2 SPRAYS INTO EACH NOSTRIL TWICE A DAY, Disp: , Rfl: 11 .  diltiazem (TIAZAC) 120 MG 24 hr capsule, Take 120 mg by mouth 2 (two) times daily., Disp: , Rfl: 0 .  Magnesium 500 MG CAPS, Take 500 mg by mouth., Disp: , Rfl:  .  olmesartan-hydrochlorothiazide (BENICAR HCT) 40-25 MG tablet, Take 1 tablet by mouth daily., Disp: , Rfl: 0 .  pantoprazole (PROTONIX) 20 MG tablet, Take 1 tablet (20 mg total) by mouth daily., Disp: 30 tablet, Rfl: 0  No Known Allergies Review of  Systems Objective:  There were no vitals filed for this visit. General AA&O x3. Normal mood and affect.  Vascular Dorsalis pedis and posterior tibial pulses  present 2+ bilaterally  Capillary refill normal to all digits. Pedal hair growth normal.  Neurologic Epicritic sensation grossly present.  Dermatologic No open lesions. Interspaces clear of maceration. Bilateral fifth toenails with thickening, hyperkeratosis, listers corns  Orthopedic: MMT 5/5 in dorsiflexion, plantarflexion, inversion, and eversion. Normal joint ROM without pain or crepitus.  Adductovarus fifth toe deformities bilateral.  Left HAV deformity, right Hg deformity with evidence of surgical correction deformity present   Assessment & Plan:  Patient was evaluated and treated and all questions answered.  Onychomycosis fifth toes bilateral with listers corns -Nails and hyperkeratotic lesions carefully debrided x2.  Patient tolerated relief post debridement.  Educated on proper self-care.  HAV deformity left -Patient does not wish to discuss surgical correction at this time.  No follow-ups on file.

## 2017-09-23 ENCOUNTER — Encounter: Payer: BC Managed Care – PPO | Admitting: Vascular Surgery

## 2017-10-14 ENCOUNTER — Encounter: Payer: BC Managed Care – PPO | Admitting: Vascular Surgery

## 2017-11-03 DIAGNOSIS — H6691 Otitis media, unspecified, right ear: Secondary | ICD-10-CM | POA: Diagnosis not present

## 2017-11-03 DIAGNOSIS — R05 Cough: Secondary | ICD-10-CM | POA: Diagnosis not present

## 2017-11-25 ENCOUNTER — Ambulatory Visit (INDEPENDENT_AMBULATORY_CARE_PROVIDER_SITE_OTHER): Payer: BC Managed Care – PPO | Admitting: Vascular Surgery

## 2017-11-25 ENCOUNTER — Encounter: Payer: Self-pay | Admitting: Vascular Surgery

## 2017-11-25 VITALS — BP 144/72 | HR 62 | Temp 97.3°F | Resp 16 | Ht 67.0 in | Wt 198.0 lb

## 2017-11-25 DIAGNOSIS — I719 Aortic aneurysm of unspecified site, without rupture: Secondary | ICD-10-CM

## 2017-11-25 NOTE — Progress Notes (Signed)
[facility] [place]  Patient name: Connie James MRN: [id number] DOB: Feb 19, 1946 Sex: female  REASON FOR CONSULT: Gus incidental finding of penetrating ulcer infrarenal aorta  HPI: Connie James is a 72 y.o. 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  She had presented to the emergency room in March with concern regarding extreme high blood pressure and discoloration of her hands and some chest discomfort.  She had a CT scan of her chest abdomen and pelvis.  This showed no evidence of thoracic dissection.  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  She is seen today for further discussion of this.  She was found to have blood pressure excess of 200.  Reports that she has had 2 episodes now over the last several months where she has had an ear infection was found to have severe hypertension during that time.  She reports that her usual state is not hypertensive  Past Medical History:  Diagnosis Date  . Bronchitis   . Colon polyp   . Constipation   . Contact lens/glasses fitting   . Heart murmur   . Hemorrhoids   . Hypertension   . Hypoglycemia   . Poor circulation    in hands  . Wears dentures     Family History  Problem Relation Age of Onset  . Heart disease Mother   . Alzheimer's disease Mother   . Emphysema Father   . Lung disease Father   . Hypertension Brother     SOCIAL HISTORY: Social History   Socioeconomic History  . Marital status: Married    Spouse name: Not on file  . Number of children: Not on file  . Years of education: Not on file  . Highest education level: Not on file  Occupational History  . Not on file  Social Needs  . Financial resource strain: Not on file  . Food insecurity:    Worry: Not on file    Inability: Not on file  . Transportation needs:    Medical: Not on file   Non-medical: Not on file  Tobacco Use  . Smoking status: Never Smoker  . Smokeless tobacco: Never Used  Substance and Sexual Activity  . Alcohol use: No  . Drug use: No  . Sexual activity: Not on file  Lifestyle  . Physical activity:    Days per week: Not on file    Minutes per session: Not on file  . Stress: Not on file  Relationships  . Social connections:    Talks on phone: Not on file    Gets together: Not on file    Attends religious service: Not on file    Active member of club or organization: Not on file    Attends meetings of clubs or organizations: Not on file    Relationship status: Not on file  . Intimate partner violence:    Fear of current or ex partner: Not on file    Emotionally abused: Not on file    Physically abused: Not on file    Forced sexual activity: Not on file  Other Topics Concern  . Not on file  Social History Narrative  . Not on file    No Known Allergies  Current Outpatient Medications  Medication Sig Dispense Refill  . azelastine (ASTELIN) 0.1 % nasal spray SPRAY 2 SPRAYS INTO EACH NOSTRIL TWICE A  DAY  11  . cholecalciferol (VITAMIN D) 1000 units tablet Take 1,000 Units by mouth daily.    Marland Kitchen diltiazem (TIAZAC) 120 MG 24 hr capsule Take 120 mg by mouth 2 (two) times daily.  0  . Magnesium 500 MG CAPS Take 500 mg by mouth.    . olmesartan-hydrochlorothiazide (BENICAR HCT) 40-25 MG tablet Take 1 tablet by mouth daily.  0  . acetaminophen (TYLENOL) 500 MG tablet Take 1 tablet (500 mg total) by mouth every 6 (six) hours as needed for mild pain or moderate pain. (Patient not taking: Reported on 08/22/2017) 30 tablet 0  . amoxicillin-clavulanate (AUGMENTIN) 875-125 MG tablet TAKE 1 TABLET (875 MG) BY MOUTH 2 TIMES PER DAY FOR 10 DAYS  0  . pantoprazole (PROTONIX) 20 MG tablet Take 1 tablet (20 mg total) by mouth daily. (Patient not taking: Reported on 11/25/2017) 30 tablet 0   No current facility-administered medications for this visit.     REVIEW  OF SYSTEMS:  [X]  denotes positive finding, [ ]  denotes negative finding Cardiac  Comments:  Chest pain or chest pressure:    Shortness of breath upon exertion:    Short of breath when lying flat:    Irregular heart rhythm:        Vascular    Pain in calf, thigh, or hip brought on by ambulation:    Pain in feet at night that wakes you up from your sleep:     Blood clot in your veins:    Leg swelling:         Pulmonary    Oxygen at home:    Productive cough:     Wheezing:         Neurologic    Sudden weakness in arms or legs:     Sudden numbness in arms or legs:     Sudden onset of difficulty speaking or slurred speech:    Temporary loss of vision in one eye:     Problems with dizziness:         Gastrointestinal    Blood in stool:     Vomited blood:         Genitourinary    Burning when urinating:     Blood in urine:        Psychiatric    Major depression:         Hematologic    Bleeding problems:    Problems with blood clotting too easily:        Skin    Rashes or ulcers:        Constitutional    Fever or chills:      PHYSICAL EXAM: Vitals:   11/25/17 1246  BP: (!) 144/72  Pulse: 62  Resp: 16  Temp: (!) 97.3 F (36.3 C)  SpO2: 98%  Weight: 198 lb (89.8 kg)  Height: 5\' 7"  (1.702 m)    GENERAL: The patient is a well-nourished female, in no acute distress. The vital signs are documented above. CARDIOVASCULAR: Carotid arteries without bruits bilaterally.  2+ radial 2+ femoral and 2+ dorsalis pedis pulses bilaterally PULMONARY: There is good air exchange  ABDOMEN: Soft and non-tender.  No bruit noted MUSCULOSKELETAL: There are no major deformities or cyanosis. NEUROLOGIC: No focal weakness or paresthesias are detected. SKIN: There are no ulcers or rashes noted. PSYCHIATRIC: The patient has a normal affect.  DATA:  I reviewed her CT scan and specifically reviewed images with the patient.  I did show her the area of  penetrating ulcer in the infrarenal  aorta above the aortic bifurcation.  This is with minimal dilatation of the artery at that position.  MEDICAL ISSUES: I have her suggested that we repeat her CT scan in 6 months to rule out any progression of this.  If there is unchanged then would increase this interval for follow-up.  I did discuss symptoms of leaking aneurysm with the patient and she knows to present emergently immediately to the emergency room should this occur   Rosetta Posner, MD [facility] Tel [phone] Pager [phone]

## 2017-12-03 ENCOUNTER — Other Ambulatory Visit: Payer: Self-pay

## 2017-12-25 ENCOUNTER — Other Ambulatory Visit: Payer: Self-pay

## 2017-12-25 DIAGNOSIS — I719 Aortic aneurysm of unspecified site, without rupture: Secondary | ICD-10-CM

## 2018-05-12 ENCOUNTER — Other Ambulatory Visit: Payer: BC Managed Care – PPO

## 2018-05-13 ENCOUNTER — Ambulatory Visit
Admission: RE | Admit: 2018-05-13 | Discharge: 2018-05-13 | Disposition: A | Discharge status: Home / Self Care | Payer: BC Managed Care – PPO | Source: Ambulatory Visit | Attending: Vascular Surgery | Admitting: Vascular Surgery

## 2018-05-13 DIAGNOSIS — I719 Aortic aneurysm of unspecified site, without rupture: Secondary | ICD-10-CM

## 2018-05-13 DIAGNOSIS — I714 Abdominal aortic aneurysm, without rupture: Secondary | ICD-10-CM | POA: Diagnosis not present

## 2018-05-13 MED ORDER — IOPAMIDOL (ISOVUE-370) INJECTION 76%
75.0000 mL | Freq: Once | INTRAVENOUS | Status: AC | PRN
  Administered 2018-05-13: 75 mL via INTRAVENOUS

## 2018-05-19 ENCOUNTER — Other Ambulatory Visit: Payer: Self-pay

## 2018-05-19 ENCOUNTER — Encounter: Payer: Self-pay | Admitting: Vascular Surgery

## 2018-05-19 ENCOUNTER — Ambulatory Visit (INDEPENDENT_AMBULATORY_CARE_PROVIDER_SITE_OTHER): Payer: BC Managed Care – PPO | Admitting: Vascular Surgery

## 2018-05-19 VITALS — BP 142/73 | HR 64 | Temp 97.4°F | Ht 67.0 in | Wt 197.6 lb

## 2018-05-19 DIAGNOSIS — I719 Aortic aneurysm of unspecified site, without rupture: Secondary | ICD-10-CM | POA: Diagnosis not present

## 2018-05-19 NOTE — Progress Notes (Signed)
[facility] [place]  Patient name: Connie James MRN: [id number] DOB: 05/07/1946 Sex: female  REASON FOR VISIT: CT follow-up of incidental finding of penetrating ulcer infrarenal abdominal aorta  HPI: Connie James is a 72 y.o. female today for follow-up.  She reports that she has been having some issues with abdominal cramping.  Also reports apparently a new finding of an esophageal lesion which is being worked up.  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 Bronchitis   . Colon polyp   . Constipation   . Contact lens/glasses fitting   . Heart murmur   . Hemorrhoids   . Hypertension   . Hypoglycemia   . Poor circulation    in hands  . Wears dentures     Family History  Problem Relation Age of Onset  . Heart disease Mother   . Alzheimer's disease Mother   . Emphysema Father   . Lung disease Father   . Hypertension Brother     SOCIAL HISTORY: Social History   Tobacco Use  . Smoking status: Never Smoker  . Smokeless tobacco: Never Used  Substance Use Topics  . Alcohol use: No    No Known Allergies  Current Outpatient Medications  Medication Sig Dispense Refill  . acetaminophen (TYLENOL) 500 MG tablet Take 1 tablet (500 mg total) by mouth every 6 (six) hours as needed for mild pain or moderate pain. 30 tablet 0  . cholecalciferol (VITAMIN D) 1000 units tablet Take 1,000 Units by mouth daily.    Marland Kitchen diltiazem (TIAZAC) 120 MG 24 hr capsule Take 120 mg by mouth 2 (two) times daily.  0  . hydrochlorothiazide (HYDRODIURIL) 25 MG tablet TAKE 1 TABLET BY MOUTH EVERY DAY IN THE MORNING  5  . losartan (COZAAR) 50 MG tablet Take 50 mg by mouth daily.  1  . Magnesium 500 MG CAPS Take 500 mg by mouth.    . pantoprazole (PROTONIX) 20 MG tablet Take 1 tablet (20 mg total) by mouth daily. (Patient not taking: Reported on  11/25/2017) 30 tablet 0   No current facility-administered medications for this visit.     REVIEW OF SYSTEMS:  [X]  denotes positive finding, [ ]  denotes negative finding Cardiac  Comments:  Chest pain or chest pressure:    Shortness of breath upon exertion:    Short of breath when lying flat:    Irregular heart rhythm:        Vascular    Pain in calf, thigh, or hip brought on by ambulation:    Pain in feet at night that wakes you up from your sleep:     Blood clot in your veins:    Leg swelling:           PHYSICAL EXAM: Vitals:   05/19/18 1033  BP: (!) 142/73  Pulse: 64  Temp: (!) 97.4 F (36.3 C)  TempSrc: Oral  SpO2: 100%  Weight: 197 lb 9.6 oz (89.6 kg)  Height: 5\' 7"  (1.702 m)    GENERAL: The patient is a well-nourished female, in no acute distress. The vital signs are documented above. CARDIOVASCULAR: Plus radial 2+ femoral 2+ popliteal and 2+ dorsalis pedis pulses.  No bruits in her abdomen and no aneurysm palpable. PULMONARY: There is good air exchange  MUSCULOSKELETAL: There are no major deformities or cyanosis. NEUROLOGIC: No focal weakness or paresthesias are detected. SKIN: There  are no ulcers or rashes noted. PSYCHIATRIC: The patient has a normal affect.  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  Maximal diameter of the aorta at this level is 2.5 cm.  MEDICAL ISSUES: I reviewed the actual images with the patient.  Explained that this incidental finding is causing her no symptoms and in all likelihood lifelong would not cause her any increased risk.  I explained that it is reassuring that we now have six-month follow-up showing no change in this stable penetrating ulcer.  I have recommended that we see her in 2 years with ultrasound to rule out any enlargement.  I did explain that in all likelihood this will not be an issue lifelong but that we will continue with ultrasound surveillance    Rosetta Posner, MD [facility] Tel [phone] Pager [phone]

## 2018-07-08 ENCOUNTER — Other Ambulatory Visit: Payer: Self-pay

## 2018-07-08 ENCOUNTER — Emergency Department (HOSPITAL_COMMUNITY): Payer: BC Managed Care – PPO

## 2018-07-08 ENCOUNTER — Encounter (HOSPITAL_COMMUNITY): Payer: Self-pay | Admitting: *Deleted

## 2018-07-08 ENCOUNTER — Emergency Department (HOSPITAL_COMMUNITY)
Admission: EM | Admit: 2018-07-08 | Discharge: 2018-07-08 | Disposition: A | Discharge status: Home / Self Care | Payer: BC Managed Care – PPO | Attending: Emergency Medicine | Admitting: Emergency Medicine

## 2018-07-08 DIAGNOSIS — I440 Atrioventricular block, first degree: Secondary | ICD-10-CM | POA: Diagnosis not present

## 2018-07-08 DIAGNOSIS — R55 Syncope and collapse: Secondary | ICD-10-CM | POA: Insufficient documentation

## 2018-07-08 DIAGNOSIS — R0602 Shortness of breath: Secondary | ICD-10-CM | POA: Diagnosis not present

## 2018-07-08 DIAGNOSIS — Z79899 Other long term (current) drug therapy: Secondary | ICD-10-CM | POA: Insufficient documentation

## 2018-07-08 DIAGNOSIS — R079 Chest pain, unspecified: Secondary | ICD-10-CM | POA: Diagnosis not present

## 2018-07-08 DIAGNOSIS — R7989 Other specified abnormal findings of blood chemistry: Secondary | ICD-10-CM

## 2018-07-08 DIAGNOSIS — R072 Precordial pain: Secondary | ICD-10-CM | POA: Diagnosis not present

## 2018-07-08 DIAGNOSIS — R791 Abnormal coagulation profile: Secondary | ICD-10-CM | POA: Insufficient documentation

## 2018-07-08 DIAGNOSIS — I10 Essential (primary) hypertension: Secondary | ICD-10-CM | POA: Insufficient documentation

## 2018-07-08 DIAGNOSIS — E871 Hypo-osmolality and hyponatremia: Secondary | ICD-10-CM

## 2018-07-08 LAB — CBC
HCT: 35.9 % — ABNORMAL LOW (ref 36.0–46.0)
Hemoglobin: 12 g/dL (ref 12.0–15.0)
MCH: 27.1 pg (ref 26.0–34.0)
MCHC: 33.4 g/dL (ref 30.0–36.0)
MCV: 81.2 fL (ref 80.0–100.0)
Platelets: 231 10*3/uL (ref 150–400)
RBC: 4.42 MIL/uL (ref 3.87–5.11)
RDW: 14.3 % (ref 11.5–15.5)
WBC: 16.2 10*3/uL — ABNORMAL HIGH (ref 4.0–10.5)
nRBC: 0 % (ref 0.0–0.2)

## 2018-07-08 LAB — URINALYSIS, ROUTINE W REFLEX MICROSCOPIC
BILIRUBIN URINE: NEGATIVE
Glucose, UA: NEGATIVE mg/dL
Hgb urine dipstick: NEGATIVE
Ketones, ur: NEGATIVE mg/dL
Leukocytes, UA: NEGATIVE
Nitrite: NEGATIVE
Protein, ur: NEGATIVE mg/dL
Specific Gravity, Urine: 1.008 (ref 1.005–1.030)
pH: 6 (ref 5.0–8.0)

## 2018-07-08 LAB — BASIC METABOLIC PANEL
Anion gap: 9 (ref 5–15)
BUN: 7 mg/dL — AB (ref 8–23)
CO2: 25 mmol/L (ref 22–32)
Calcium: 8.9 mg/dL (ref 8.9–10.3)
Chloride: 94 mmol/L — ABNORMAL LOW (ref 98–111)
Creatinine, Ser: 0.73 mg/dL (ref 0.44–1.00)
GFR calc Af Amer: >60 mL/min (ref >60)
GFR calc non Af Amer: >60 mL/min (ref >60)
Glucose, Bld: 93 mg/dL (ref 70–99)
POTASSIUM: 3.3 mmol/L — AB (ref 3.5–5.1)
Sodium: 128 mmol/L — ABNORMAL LOW (ref 135–145)

## 2018-07-08 LAB — I-STAT TROPONIN, ED
Troponin i, poc: 0 ng/mL (ref 0.00–0.08)
Troponin i, poc: 0 ng/mL (ref 0.00–0.08)

## 2018-07-08 LAB — MAGNESIUM: Magnesium: 2.1 mg/dL (ref 1.7–2.4)

## 2018-07-08 LAB — D-DIMER, QUANTITATIVE: D-Dimer, Quant: 1.6 ug/mL-FEU — ABNORMAL HIGH (ref 0.00–0.50)

## 2018-07-08 MED ORDER — IOPAMIDOL (ISOVUE-370) INJECTION 76%
75.0000 mL | Freq: Once | INTRAVENOUS | Status: AC | PRN
  Administered 2018-07-08: 100 mL via INTRAVENOUS

## 2018-07-08 MED ORDER — IOPAMIDOL (ISOVUE-370) INJECTION 76%
INTRAVENOUS | Status: AC
  Filled 2018-07-08: qty 100

## 2018-07-08 MED ORDER — SODIUM CHLORIDE 0.9 % IV BOLUS
1000.0000 mL | Freq: Once | INTRAVENOUS | Status: AC
  Administered 2018-07-08: 1000 mL via INTRAVENOUS

## 2018-07-08 MED ORDER — SODIUM CHLORIDE 0.9% FLUSH: 3.0000 mL | Freq: Once | INTRAVENOUS | Status: DC

## 2018-07-08 NOTE — ED Triage Notes (Signed)
Pt in c/o chest pain and syncope this morning, states she has had bronchitis recently but today it feels different, describes as cramping feeling in her chest, went to her PCP and sent here for further evaluation

## 2018-07-08 NOTE — Discharge Instructions (Signed)
Your sodium was little low today.  May have some dehydration.  Follow-up with Dr. Ernie Hew about the lab abnormalities and medications adjustments.

## 2018-07-08 NOTE — ED Provider Notes (Signed)
[facility] EMERGENCY DEPARTMENT Provider Note   CSN: [id number] Arrival date & time: 07/08/18  1106     History   Chief Complaint Chief Complaint  Patient presents with  . Chest Pain  . Loss of Consciousness    HPI [name] is a 73 y.o. female.  The history is provided by the patient and medical records. No language interpreter was used.  Shortness of Breath  Severity:  Severe Onset quality:  Gradual Duration:  2 weeks Timing:  Intermittent Progression:  Waxing and waning Chronicity:  New Context: URI (recent bronchitis)   Relieved by:  Nothing Worsened by:  Nothing Ineffective treatments:  None tried Associated symptoms: chest pain (tightness), cough and syncope   Associated symptoms: no abdominal pain, no diaphoresis, no fever, no neck pain, no rash, no vomiting and no wheezing   Syncope:    Witnessed: no     Suspicion of head trauma:  No   Past Medical History:  Diagnosis Date  . Bronchitis   . Colon polyp   . Constipation   . Contact lens/glasses fitting   . Heart murmur   . Hemorrhoids   . Hypertension   . Hypoglycemia   . Poor circulation    in hands  . Wears dentures     Patient Active Problem List   Diagnosis Date Noted  . Environmental allergies 09/11/2017  . Depression 09/11/2017  . Atrophic vaginitis 06/30/2015  . Hematuria 06/16/2015  . Bladder pain 06/16/2015  . Lymphadenopathy 01/19/2015  . 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 Abnormal ECG 04/25/2014  . Anxiety 10/31/2013  . Vaso vagal episode 12/15/2012  . HTN (hypertension) 12/15/2012    Past Surgical History:  Procedure Laterality Date  . ABDOMINAL HYSTERECTOMY  01/1994  . CHOLECYSTECTOMY  08/2002  . removal of tumor and ovary  11/2005     OB History   No obstetric history on file.      Home Medications    Prior to Admission medications   Medication Sig Start Date End Date Taking? Authorizing Provider  acetaminophen (TYLENOL) 500 MG tablet  Take 1 tablet (500 mg total) by mouth every 6 (six) hours as needed for mild pain or moderate pain. 09/16/16   [name], PA-C  cholecalciferol (VITAMIN D) 1000 units tablet Take 1,000 Units by mouth daily.    [provider]  diltiazem (TIAZAC) 120 MG 24 hr capsule Take 120 mg by mouth 2 (two) times daily. 08/07/17   [provider]  hydrochlorothiazide (HYDRODIURIL) 25 MG tablet TAKE 1 TABLET BY MOUTH EVERY DAY IN THE MORNING 05/10/18   [provider]  losartan (COZAAR) 50 MG tablet Take 50 mg by mouth daily. 05/05/18   [provider]  Magnesium 500 MG CAPS Take 500 mg by mouth.    [provider]  pantoprazole (PROTONIX) 20 MG tablet Take 1 tablet (20 mg total) by mouth daily. Patient not taking: Reported on 11/25/2017 08/23/17   Milton Ferguson, MD    Family History Family History  Problem Relation Age of Onset  . Heart disease Mother   . Alzheimer's disease Mother   . Emphysema Father   . Lung disease Father   . Hypertension Brother     Social History Social History   Tobacco Use  . Smoking status: Never Smoker  . Smokeless tobacco: Never Used  Substance Use Topics  . Alcohol use: No  . Drug use: No     Allergies   Patient has no  known allergies.   Review of Systems Review of Systems  Constitutional: Positive for fatigue. Negative for chills, diaphoresis and fever.  HENT: Negative for congestion and rhinorrhea.   Eyes: Negative for visual disturbance.  Respiratory: Positive for cough, chest tightness and shortness of breath. Negative for choking, wheezing and stridor.   Cardiovascular: Positive for chest pain (tightness) and syncope.  Gastrointestinal: Positive for nausea. Negative for abdominal pain, blood in stool, constipation, diarrhea and vomiting.  Genitourinary: Positive for frequency. Negative for decreased urine volume and dysuria.  Musculoskeletal: Negative for back pain, neck pain and neck stiffness.  Skin:  Negative for rash and wound.  Neurological: Positive for syncope and light-headedness. Negative for dizziness, facial asymmetry and numbness.  Psychiatric/Behavioral: Negative for agitation and confusion.     Physical Exam Updated Vital Signs BP (!) 141/69 (BP Location: Right Arm)   Pulse 94   Temp 99.4 F (37.4 C) (Oral)   Resp 16   Ht 5\' 7"  (1.702 m)   Wt 88 kg   SpO2 100%   BMI 30.38 kg/m   Physical Exam Vitals signs and nursing note reviewed.  Constitutional:      General: She is not in acute distress.    Appearance: She is well-developed. She is not ill-appearing, toxic-appearing or diaphoretic.  HENT:     Head: Normocephalic and atraumatic.  Eyes:     Conjunctiva/sclera: Conjunctivae normal.     Pupils: Pupils are equal, round, and reactive to light.  Neck:     Musculoskeletal: Normal range of motion and neck supple.  Cardiovascular:     Rate and Rhythm: Regular rhythm. Tachycardia present.     Heart sounds: No murmur.  Pulmonary:     Effort: Pulmonary effort is normal. No tachypnea or respiratory distress.     Breath sounds: Normal breath sounds. No decreased breath sounds, wheezing, rhonchi or rales.  Chest:     Chest wall: No tenderness.  Abdominal:     Palpations: Abdomen is soft.     Tenderness: There is no abdominal tenderness.  Musculoskeletal:     Right lower leg: She exhibits no tenderness. No edema.     Left lower leg: She exhibits no tenderness.  Skin:    General: Skin is warm and dry.     Capillary Refill: Capillary refill takes less than 2 seconds.  Neurological:     General: No focal deficit present.     Mental Status: She is alert and oriented to person, place, and time.      ED Treatments / Results  Labs (all labs ordered are listed, but only abnormal results are displayed) Labs Reviewed  BASIC METABOLIC PANEL - Abnormal; Notable for the following components:      Result Value   Sodium 128 (*)    Potassium 3.3 (*)    Chloride 94 (*)     BUN 7 (*)    All other components within normal limits  CBC - Abnormal; Notable for the following components:   WBC 16.2 (*)    HCT 35.9 (*)    All other components within normal limits  D-DIMER, QUANTITATIVE (NOT AT [facility]) - Abnormal; Notable for the following components:   D-Dimer, Quant 1.60 (*)    All other components within normal limits  URINALYSIS, ROUTINE W REFLEX MICROSCOPIC - Abnormal; Notable for the following components:   Color, Urine STRAW (*)    All other components within normal limits  URINE CULTURE  MAGNESIUM  I-STAT TROPONIN, ED  I-STAT TROPONIN,  ED    EKG EKG Interpretation  Date/Time:  Wednesday July 08 2018 11:18:25 EST Ventricular Rate:  96 PR Interval:  220 QRS Duration: 94 QT Interval:  336 QTC Calculation: 424 R Axis:   -55 Text Interpretation:  Sinus rhythm with 1st degree A-V block Biatrial enlargement Left axis deviation Incomplete right bundle branch block Anterior infarct , age undetermined Abnormal ECG When compared to prior, faster rate.  No STEMI Confirmed by Antony Blackbird [phone]) on 07/08/2018 1:14:55 PM   Radiology Dg Chest 2 View  Result Date: 07/08/2018 CLINICAL DATA:  Chest pain. Patient passed out this morning. Recent bronchitis with fever. History of hypertension. EXAM: CHEST - 2 VIEW COMPARISON:  08/22/2017 FINDINGS: Heart size is normal. No pleural effusion identified. Lungs are suboptimally inflated but clear. No airspace opacities. Mild spondylosis within the thoracic spine. IMPRESSION: 1. No acute cardiopulmonary abnormalities. Electronically Signed   By: Kerby Moors M.D.   On: 07/08/2018 11:57    Procedures Procedures (including critical care time)  Medications Ordered in ED Medications  sodium chloride flush (NS) 0.9 % injection 3 mL (has no administration in time range)  iopamidol (ISOVUE-370) 76 % injection (has no administration in time range)  sodium chloride 0.9 % bolus 1,000 mL (1,000 mLs Intravenous New  Bag/Given 07/08/18 1346)  iopamidol (ISOVUE-370) 76 % injection 75 mL (100 mLs Intravenous Contrast Given 07/08/18 1441)     Initial Impression / Assessment and Plan / ED Course  I have reviewed the triage vital signs and the nursing notes.  Pertinent labs & imaging results that were available during my care of the patient were reviewed by me and considered in my medical decision making (see chart for details).     Tatyana Biber Fleig is a 73 y.o. female with a past medical history significant for hypertension and recent treatment for bronchitis who presents from her PCP office for chest pain, syncopal episode, lightheadedness, shortness of breath.  Patient reports that she is just getting over a bronchitis spell which she had 2 weeks ago.  She reports taking antibiotics for this and was feeling better.  She reports that last night she was "feeling off" but was able to go to bed.  She reports this morning she woke up after standing up and walking towards the bathroom she had a syncopal episode.  She reports no significant palpitations before the episode but she does not remember specifically.  She reports after waking back up she was having some chest aching, "chest cramping" and sharp chest pain.  She reports no shortness of breath with this.  She denies nausea, vomiting, diaphoresis.  She denies palpitations.  She reports that her symptoms were somewhat exertional.  She denies any history of DVT or PE.  She denies leg pain or leg swelling.  Denies other symptoms.  She denies hitting her head.  She also reports she has been having some urinary frequency the last few days.  No significant dysuria.  Will assess for UTI.  Patient went to PCP office where she had an EKG which was reportedly concerning for arrhythmia.  Patient was sent to the emergency department for evaluation for the syncope chest pain and possible arrhythmia.  On my initial exam, patient was tachycardic in the low 100 range.  Patient had  no more chest pain or shortness of breath.  Patient resting comfortably on room air.  Patient's lungs were clear and chest was nontender, as her discomfort was not reproducible.  Legs were nontender nonswollen.  No evidence of trauma seen.  Patient had initial EKG showing no significant arrhythmia or STEMI.  Given patient initial tachycardia with chest pain, shortness breath, syncope, d-dimer will be added.  Patient magnesium checked.  Patient's laboratory testing showed some hyponatremia hypo-chloremia, and hypokalemia.  Patient given some fluids for likely dehydration.  Patient has mild leukocytosis but normal hemoglobin.  Chest x-ray shows no pneumonia however based on the patient's recent cough and her chest pain shortness of breath symptoms, will consider a noncontrasted CT scan to further rule out pneumonia.  If d-dimer is positive, will likely obtain a CT PE study.  Anticipate reassessment after work-up.  Urinalysis shows no nitrites or leukocytes, doubt UTI.   D-dimer was elevated, will obtain CT PE study to further rule out pulmonary embolism or to see occult pneumonia.  If CT PE study is negative then patient is able to ambulate without symptoms of orthostasis or syncope and delta troponin is negative, anticipate she will be stable for discharge home.  Care transferred to oncoming team for reassessment after work-up is completed.   Final Clinical Impressions(s) / ED Diagnoses   Final diagnoses:  Syncope, unspecified syncope type  Shortness of breath  Precordial pain  Positive D dimer    ED Discharge Orders    None     Clinical Impression: 1. Syncope, unspecified syncope type   2. Shortness of breath   3. Precordial pain   4. Positive D dimer     Disposition: Awaiting results of CT PE study, delta troponin, and reassessment with ambulation.  Anticipate discharge if symptoms have resolved and patient is well-appearing.  This note was prepared with assistance of Actor. Occasional wrong-word or sound-a-like substitutions may have occurred due to the inherent limitations of voice recognition software.      , Gwenyth Allegra, MD 07/08/18 1901

## 2018-07-08 NOTE — ED Provider Notes (Signed)
  Physical Exam  BP (!) 176/81   Pulse 80   Temp 99.4 F (37.4 C) (Oral)   Resp 20   Ht 5\' 7"  (1.702 m)   Wt 88 kg   SpO2 90%   BMI 30.38 kg/m   Physical Exam  ED Course/Procedures     Procedures  MDM  Received patient in signout.  Had a syncopal episode.  Dull chest pain.  D-dimer was elevated but CT negative.  Hyponatremia but feels better after IV fluids.  Has been ambulatory to bathroom.  I think reasonable to discharge home.  Troponin negative x2.       Davonna Belling, MD 07/08/18 [phone]

## 2018-07-09 ENCOUNTER — Other Ambulatory Visit: Payer: Self-pay | Admitting: Family Medicine

## 2018-07-09 ENCOUNTER — Ambulatory Visit
Admission: RE | Admit: 2018-07-09 | Discharge: 2018-07-09 | Disposition: A | Discharge status: Home / Self Care | Payer: BC Managed Care – PPO | Source: Ambulatory Visit | Attending: Family Medicine | Admitting: Family Medicine

## 2018-07-09 DIAGNOSIS — Z6830 Body mass index (BMI) 30.0-30.9, adult: Secondary | ICD-10-CM | POA: Diagnosis not present

## 2018-07-09 DIAGNOSIS — R42 Dizziness and giddiness: Secondary | ICD-10-CM | POA: Diagnosis not present

## 2018-07-09 DIAGNOSIS — R55 Syncope and collapse: Secondary | ICD-10-CM

## 2018-07-09 DIAGNOSIS — R7989 Other specified abnormal findings of blood chemistry: Secondary | ICD-10-CM

## 2018-07-09 DIAGNOSIS — E871 Hypo-osmolality and hyponatremia: Secondary | ICD-10-CM | POA: Diagnosis not present

## 2018-07-09 DIAGNOSIS — E876 Hypokalemia: Secondary | ICD-10-CM | POA: Diagnosis not present

## 2018-07-09 LAB — URINE CULTURE

## 2018-08-13 ENCOUNTER — Ambulatory Visit (INDEPENDENT_AMBULATORY_CARE_PROVIDER_SITE_OTHER): Payer: BC Managed Care – PPO | Admitting: Podiatry

## 2018-08-13 DIAGNOSIS — M2042 Other hammer toe(s) (acquired), left foot: Secondary | ICD-10-CM

## 2018-08-13 DIAGNOSIS — M79609 Pain in unspecified limb: Secondary | ICD-10-CM | POA: Diagnosis not present

## 2018-08-13 DIAGNOSIS — Q6689 Other  specified congenital deformities of feet: Secondary | ICD-10-CM

## 2018-08-13 DIAGNOSIS — M205X2 Other deformities of toe(s) (acquired), left foot: Secondary | ICD-10-CM

## 2018-08-13 DIAGNOSIS — M205X1 Other deformities of toe(s) (acquired), right foot: Secondary | ICD-10-CM | POA: Diagnosis not present

## 2018-08-13 DIAGNOSIS — M2041 Other hammer toe(s) (acquired), right foot: Secondary | ICD-10-CM

## 2018-08-13 DIAGNOSIS — B351 Tinea unguium: Secondary | ICD-10-CM

## 2018-08-13 NOTE — Patient Instructions (Signed)
Pre-Operative Instructions  Congratulations, you have decided to take an important step towards improving your quality of life.  You can be assured that the doctors and staff at [facility] will be with you every step of the way.  Here are some important things you should know:  1. Plan to be at the surgery center/hospital at least 1 (one) hour prior to your scheduled time, unless otherwise directed by the surgical center/hospital staff.  You must have a responsible adult accompany you, remain during the surgery and drive you home.  Make sure you have directions to the surgical center/hospital to ensure you arrive on time. 2. If you are having surgery at [facility] or [facility] hospitals, you will need a copy of your medical history and physical form from your family physician within one month prior to the date of surgery. We will give you a form for your primary physician to complete.  3. We make every effort to accommodate the date you request for surgery.  However, there are times where surgery dates or times have to be moved.  We will contact you as soon as possible if a change in schedule is required.   4. No aspirin/ibuprofen for one week before surgery.  If you are on aspirin, any non-steroidal anti-inflammatory medications (Mobic, Aleve, Ibuprofen) should not be taken seven (7) days prior to your surgery.  You make take Tylenol for pain prior to surgery.  5. Medications - If you are taking daily heart and blood pressure medications, seizure, reflux, allergy, asthma, anxiety, pain or diabetes medications, make sure you notify the surgery center/hospital before the day of surgery so they can tell you which medications you should take or avoid the day of surgery. 6. No food or drink after midnight the night before surgery unless directed otherwise by surgical center/hospital staff. 7. No alcoholic beverages 24-hours prior to surgery.  No smoking 24-hours prior or 24-hours after  surgery. 8. Wear loose pants or shorts. They should be loose enough to fit over bandages, boots, and casts. 9. Don't wear slip-on shoes. Sneakers are preferred. 10. Bring your boot with you to the surgery center/hospital.  Also bring crutches or a walker if your physician has prescribed it for you.  If you do not have this equipment, it will be provided for you after surgery. 11. If you have not been contacted by the surgery center/hospital by the day before your surgery, call to confirm the date and time of your surgery. 12. Leave-time from work may vary depending on the type of surgery you have.  Appropriate arrangements should be made prior to surgery with your employer. 13. Prescriptions will be provided immediately following surgery by your doctor.  Fill these as soon as possible after surgery and take the medication as directed. Pain medications will not be refilled on weekends and must be approved by the doctor. 14. Remove nail polish on the operative foot and avoid getting pedicures prior to surgery. 15. Wash the night before surgery.  The night before surgery wash the foot and leg well with water and the antibacterial soap provided. Be sure to pay special attention to beneath the toenails and in between the toes.  Wash for at least three (3) minutes. Rinse thoroughly with water and dry well with a towel.  Perform this wash unless told not to do so by your physician.  Enclosed: 1 Ice pack (please put in freezer the night before surgery)   1 Hibiclens skin cleaner     Pre-op instructions  If you have any questions regarding the instructions, please do not hesitate to call our office.  [place]: [address], [place], [place] [address] -- [phone]  [place]: [address] [place], [place] [address] -- [phone]  [place]: [address]  [place], [place] [address] -- [phone]  [place]: [address], [address], [place], [place] [address] -- [phone]  Website:  [email or web address] 

## 2018-08-15 NOTE — Progress Notes (Signed)
Subjective:  Patient ID: Connie James, female    DOB: 14-Mar-1946,  MRN: [id number]  Chief Complaint  Patient presents with  . Nail Problem    Bilateral 5th toenials lateral sides ingrown  . Nail Problem    Trim left 3rd nail  . Nail Problem    Right 4th nail "calcium deposit"    73 y.o. female presents with the above complaint.  Think she has ingrown toenails to the outside of both fifth toes.  Reports pain to the left third toe right fourth toe and bilateral fifth toenails  Review of Systems: Negative except as noted in the HPI. Denies N/V/F/Ch.  Past Medical History:  Diagnosis Date  . Bronchitis   . Colon polyp   . Constipation   . Contact lens/glasses fitting   . Heart murmur   . Hemorrhoids   . Hypertension   . Hypoglycemia   . Poor circulation    in hands  . Wears dentures     Current Outpatient Medications:  .  acetaminophen (TYLENOL) 500 MG tablet, Take 1 tablet (500 mg total) by mouth every 6 (six) hours as needed for mild pain or moderate pain., Disp: 30 tablet, Rfl: 0 .  azithromycin (ZITHROMAX) 250 MG tablet, , Disp: , Rfl:  .  cetirizine (ZYRTEC) 10 MG tablet, Take 10 mg by mouth daily., Disp: , Rfl:  .  cholecalciferol (VITAMIN D) 1000 units tablet, Take 1,000 Units by mouth daily., Disp: , Rfl:  .  diltiazem (TIAZAC) 120 MG 24 hr capsule, Take 120 mg by mouth 2 (two) times daily., Disp: , Rfl: 0 .  hydrochlorothiazide (HYDRODIURIL) 25 MG tablet, Take 25 mg by mouth daily. , Disp: , Rfl: 5 .  losartan (COZAAR) 50 MG tablet, Take 50 mg by mouth daily., Disp: , Rfl: 1 .  Magnesium 500 MG CAPS, Take 500 mg by mouth daily. , Disp: , Rfl:  .  omeprazole (PRILOSEC) 20 MG capsule, , Disp: , Rfl:  .  pantoprazole (PROTONIX) 20 MG tablet, Take 1 tablet (20 mg total) by mouth daily., Disp: 30 tablet, Rfl: 0 .  predniSONE (DELTASONE) 10 MG tablet, , Disp: , Rfl:  .  VENTOLIN HFA 108 (90 Base) MCG/ACT inhaler, , Disp: , Rfl:   Social History   Tobacco Use    Smoking Status Never Smoker  Smokeless Tobacco Never Used    No Known Allergies Objective:  There were no vitals filed for this visit. There is no height or weight on file to calculate BMI. Constitutional Well developed. Well nourished.  Vascular Dorsalis pedis pulses palpable bilaterally. Posterior tibial pulses palpable bilaterally. Capillary refill normal to all digits.  No cyanosis or clubbing noted. Pedal hair growth normal.  Neurologic Normal speech. Oriented to person, place, and time. Epicritic sensation to light touch grossly present bilaterally.  Dermatologic Nails thickened dystrophic pain to palpation about the bilateral fifth toe, left third toe right fourth toe No open wounds. Listers corn fifth toes bilaterally  Orthopedic:  Pain palpation about the fifth toes bilateral.  Adductovarus fifth toe bilateral   Radiographs: None today Assessment:   1. Hammertoe of left foot   2. Hammertoe of right foot   3. Acquired adductovarus rotation of toe, left   4. Acquired adductovarus rotation of toe, right   5. Curly toe   6. Pain due to onychomycosis of nail    Plan:  Patient was evaluated and treated and all questions answered.  Bilateral 5th toe hammertoe -Discussed underlying mechanical  etiology of bunion deformity.  Discussed with patient conservative versus surgical care for this issue. -Patient has failed all conservative therapy and wishes to proceed with surgical intervention. All risks, benefits, and alternatives discussed with patient. No guarantees given. Consent reviewed and signed by patient. -Planned procedures: Correction left 5th hammertoe  Painful ingrown thickened nails -Nails debrided x4  Procedure: Nail Debridement Rationale: pain Type of Debridement: manual, sharp debridement. Instrumentation: Nail nipper, rotary burr. Number of Nails: 10    No follow-ups on file.

## 2018-08-18 ENCOUNTER — Encounter: Payer: Self-pay | Admitting: Podiatry

## 2018-09-15 DIAGNOSIS — Z6830 Body mass index (BMI) 30.0-30.9, adult: Secondary | ICD-10-CM | POA: Diagnosis not present

## 2018-09-15 DIAGNOSIS — Z01419 Encounter for gynecological examination (general) (routine) without abnormal findings: Secondary | ICD-10-CM | POA: Diagnosis not present

## 2018-09-15 DIAGNOSIS — Z1231 Encounter for screening mammogram for malignant neoplasm of breast: Secondary | ICD-10-CM | POA: Diagnosis not present

## 2018-09-16 DIAGNOSIS — I10 Essential (primary) hypertension: Secondary | ICD-10-CM | POA: Diagnosis not present

## 2018-09-16 DIAGNOSIS — J309 Allergic rhinitis, unspecified: Secondary | ICD-10-CM | POA: Diagnosis not present

## 2018-09-16 DIAGNOSIS — F411 Generalized anxiety disorder: Secondary | ICD-10-CM | POA: Diagnosis not present

## 2018-11-06 ENCOUNTER — Telehealth: Payer: Self-pay | Admitting: *Deleted

## 2018-11-06 NOTE — Telephone Encounter (Signed)
I'm calling you in regards to your surgery.  "I want to cancel it."  You want to cancel it.  Is there a particular reason I can tell the doctor?  "I am in the middle of retiring and changing my insurance, plus because of this virus."  I'll let Dr. Jacqualyn Posey know.  I'll cancel your surgery.  I canceled her surgery scheduled for 11/25/2018 via the surgical center's [facility] Portal.

## 2018-11-12 ENCOUNTER — Other Ambulatory Visit: Payer: Self-pay | Admitting: Family Medicine

## 2018-11-12 DIAGNOSIS — R1011 Right upper quadrant pain: Secondary | ICD-10-CM

## 2018-11-12 DIAGNOSIS — J309 Allergic rhinitis, unspecified: Secondary | ICD-10-CM | POA: Diagnosis not present

## 2018-11-12 DIAGNOSIS — I10 Essential (primary) hypertension: Secondary | ICD-10-CM | POA: Diagnosis not present

## 2018-11-12 DIAGNOSIS — F411 Generalized anxiety disorder: Secondary | ICD-10-CM | POA: Diagnosis not present

## 2018-11-23 ENCOUNTER — Telehealth: Payer: Self-pay | Admitting: *Deleted

## 2018-11-23 NOTE — Telephone Encounter (Addendum)
ERROR - PATIENT CANCELED HER SURGERY DUE TO THE CORONA VIRUS AND UPCOMING RETIREMENT ON 11/06/2018.  DOS 11/25/2018, CPT CODE: 45038 - HAMMER TOE REPAIR 5TH DIGIT LEFT FOOT  BCBS: Policy Effective : 88/28/0034 - 06/16/9998     In-Network    Max Per Benefit Period Year-to-Date Remaining  CoInsurance  20%   Deductible  $1250.00 $0.00  Out-Of-Pocket  $4890.00 $2574.17

## 2018-11-27 DIAGNOSIS — R1011 Right upper quadrant pain: Secondary | ICD-10-CM | POA: Diagnosis not present

## 2018-11-27 DIAGNOSIS — K219 Gastro-esophageal reflux disease without esophagitis: Secondary | ICD-10-CM | POA: Diagnosis not present

## 2018-11-27 DIAGNOSIS — I10 Essential (primary) hypertension: Secondary | ICD-10-CM | POA: Diagnosis not present

## 2018-12-31 DIAGNOSIS — M549 Dorsalgia, unspecified: Secondary | ICD-10-CM | POA: Diagnosis not present

## 2018-12-31 DIAGNOSIS — I10 Essential (primary) hypertension: Secondary | ICD-10-CM | POA: Diagnosis not present

## 2018-12-31 DIAGNOSIS — K5900 Constipation, unspecified: Secondary | ICD-10-CM | POA: Diagnosis not present

## 2019-01-04 IMAGING — MG 2D DIGITAL DIAGNOSTIC UNILATERAL RIGHT MAMMOGRAM WITH CAD AND AD
6 series · 6 of 14 positions shown · non-contrast
Comparison: Previous exam(s).

CLINICAL DATA: 70-year-old female presenting for your diffuse
inferior right breast pain for 1 month, now extending into the
lateral right breast and axilla.

EXAM:
2D DIGITAL DIAGNOSTIC UNILATERAL RIGHT MAMMOGRAM WITH CAD AND
ADJUNCT TOMO
RIGHT BREAST ULTRASOUND

[R MLO synth-2D]
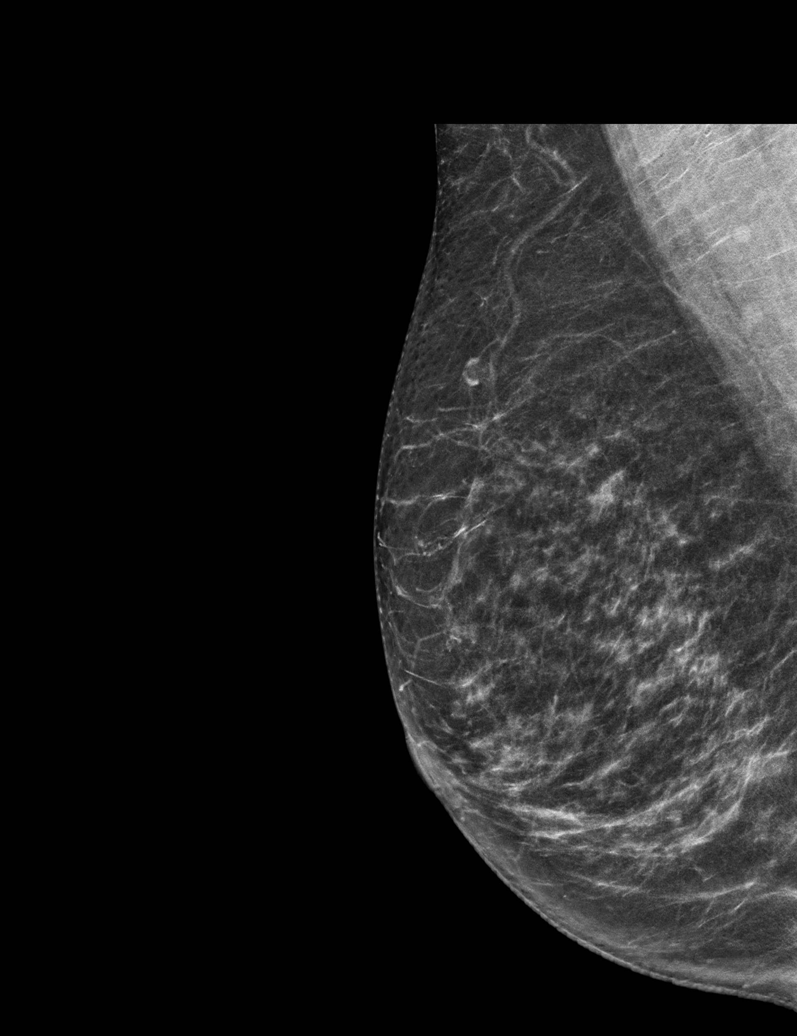

[R MLO]
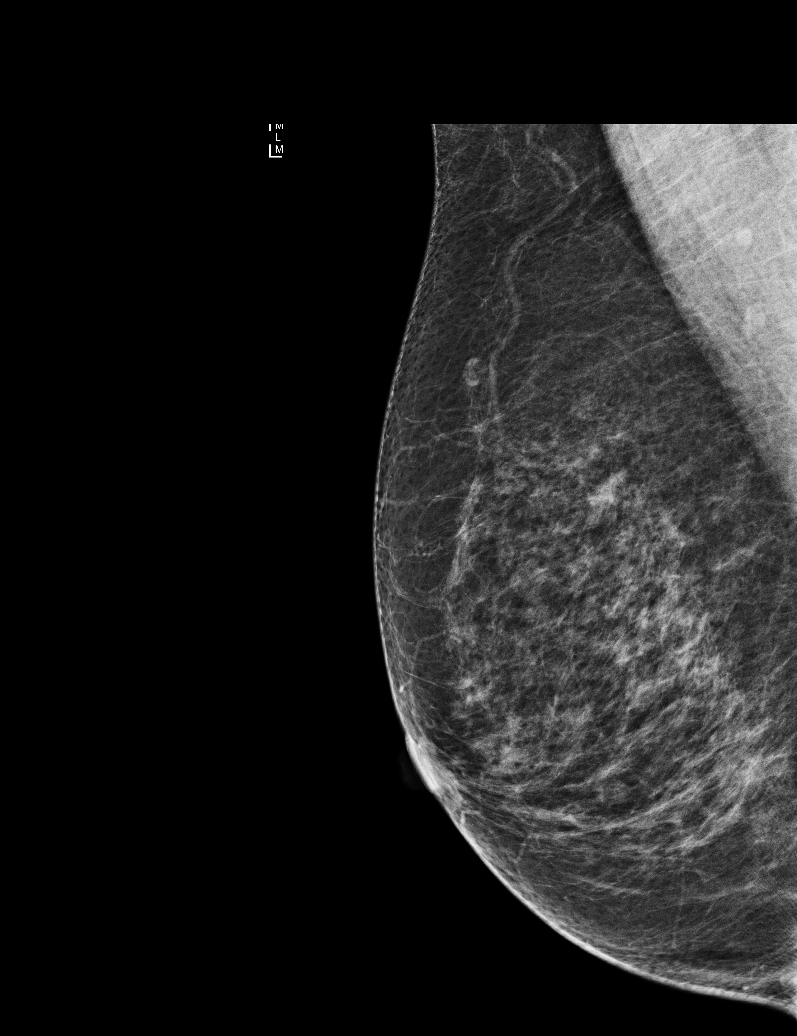

[R CC synth-2D]
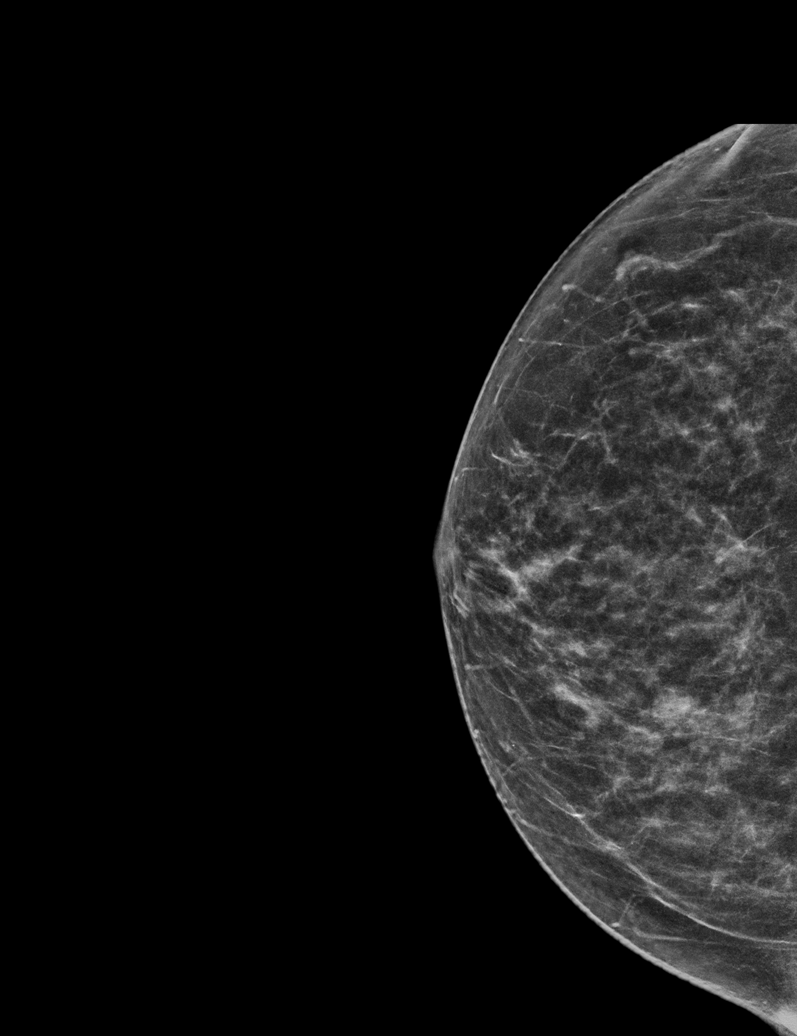

[R CC]
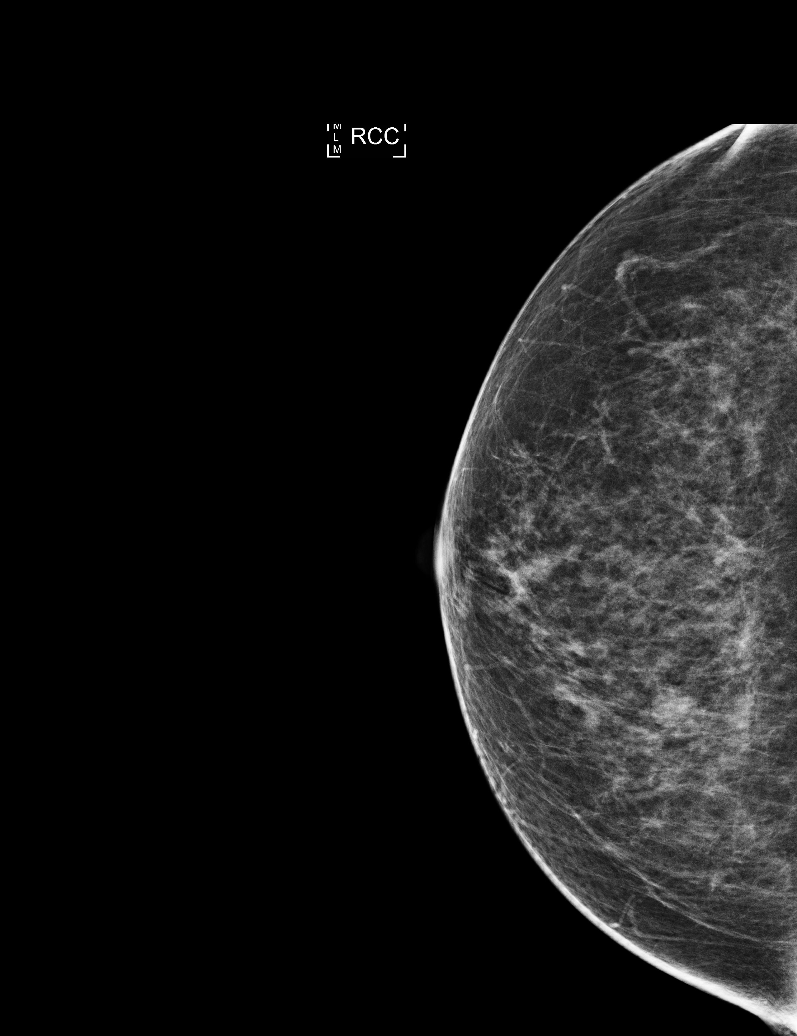

[R MLO tomo · tomo slice 33/66.0]
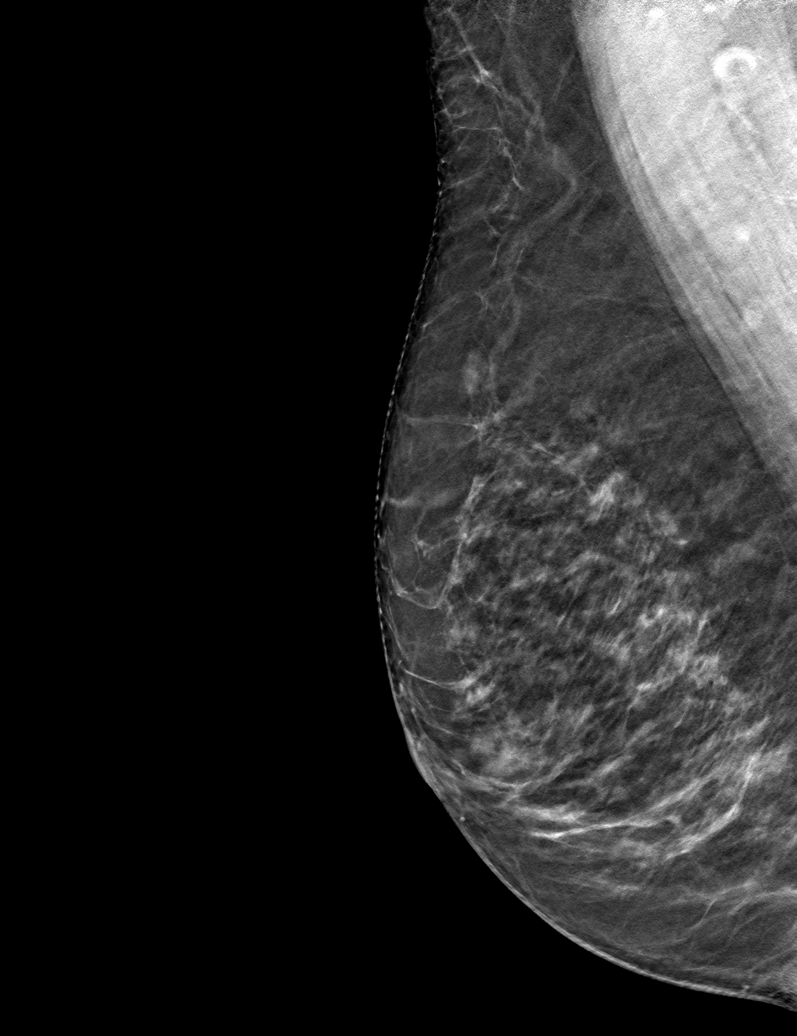

[R CC tomo · tomo slice 32/63.0]
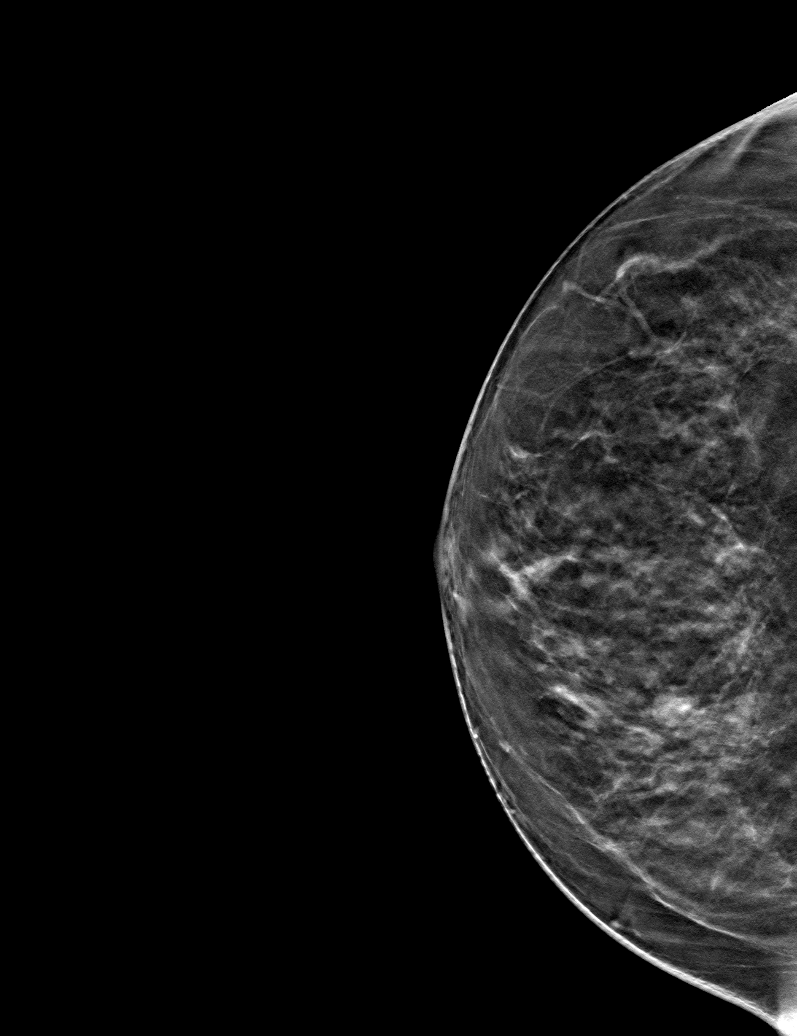

[6 of 14 positions shown; findings below may reference images not displayed]

ACR Breast Density Category b: There are scattered areas of
fibroglandular density.
FINDINGS: In the upper inner quadrant of the right breast there is a 8 mm
obscured wall with partially circumscribed margins. No other
suspicious calcifications, masses or areas of distortion are seen in
the right breast.

Mammographic images were processed with CAD.

Ultrasound targeted to the right breast at 2 o'clock, 3 cm from the
nipple demonstrates an anechoic circumscribed oval mass with
increased posterior acoustic enhancement measuring 7 x 5 x 7 mm
consistent with a benign cyst.
IMPRESSION: 1.  There is a benign cyst in the right breast at 2 o'clock.

2. No mammographic findings in the right breast to explain the
patient's diffuse inferior and lateral right breast pain.

RECOMMENDATION:
1. Clinical follow-up recommended for the pain area of concern in
the right breast. Any further workup should be based on clinical
grounds.

2. Return to routine screening mammography is recommended. The
patient will be due for screening in Wednesday May, 2017.

I have discussed the findings and recommendations with the patient.
Results were also provided in writing at the conclusion of the
visit. If applicable, a reminder letter will be sent to the patient
regarding the next appointment.

BI-RADS CATEGORY  2: Benign.

## 2019-02-01 DIAGNOSIS — K219 Gastro-esophageal reflux disease without esophagitis: Secondary | ICD-10-CM | POA: Diagnosis not present

## 2019-02-01 DIAGNOSIS — K5900 Constipation, unspecified: Secondary | ICD-10-CM | POA: Diagnosis not present

## 2019-02-01 DIAGNOSIS — K648 Other hemorrhoids: Secondary | ICD-10-CM | POA: Diagnosis not present

## 2019-02-01 DIAGNOSIS — M549 Dorsalgia, unspecified: Secondary | ICD-10-CM | POA: Diagnosis not present

## 2019-02-01 DIAGNOSIS — I10 Essential (primary) hypertension: Secondary | ICD-10-CM | POA: Diagnosis not present

## 2020-10-02 IMAGING — CR DG CHEST 2V
2 series · 2 of 2 positions shown · non-contrast
Comparison: 08/22/2017

CLINICAL DATA: Chest pain. Patient passed out this morning. Recent
bronchitis with fever. History of hypertension.

EXAM:
CHEST - 2 VIEW

[chest lat]
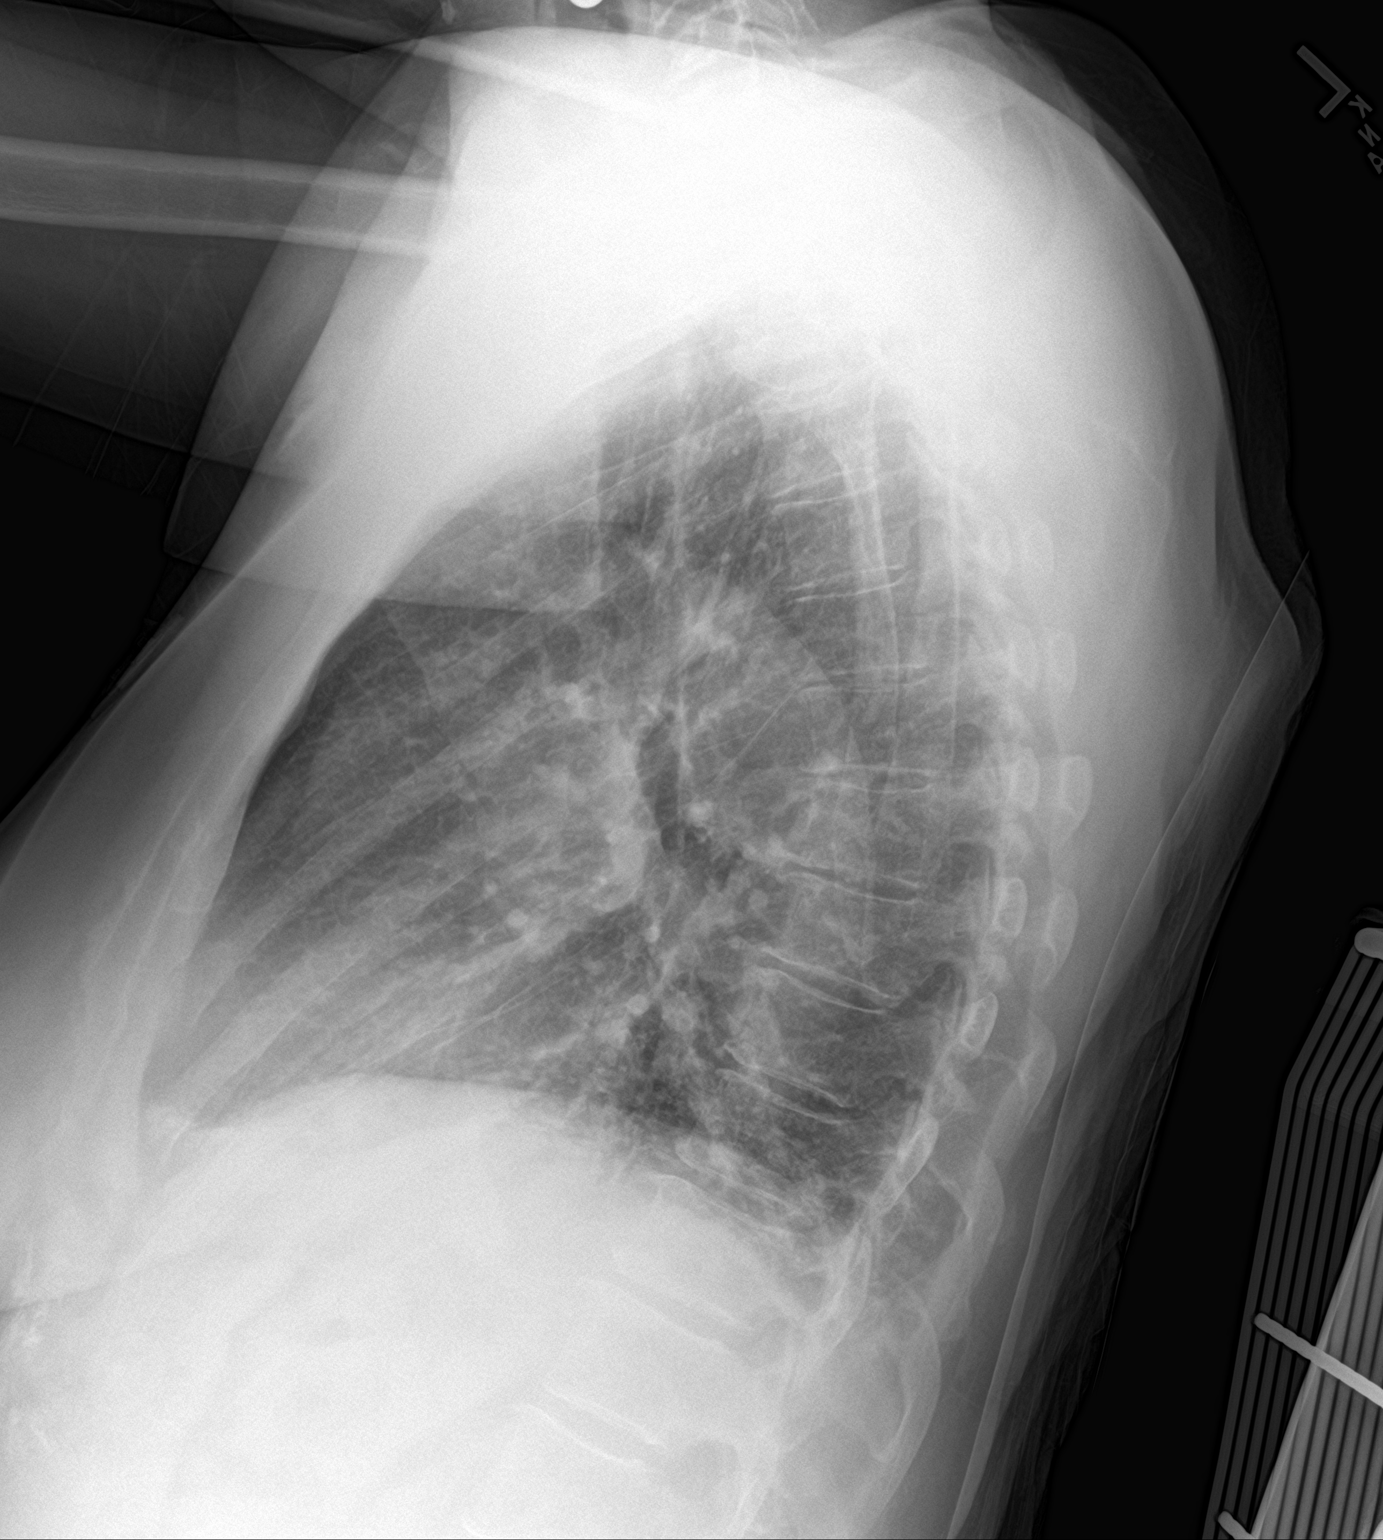

[chest ap]
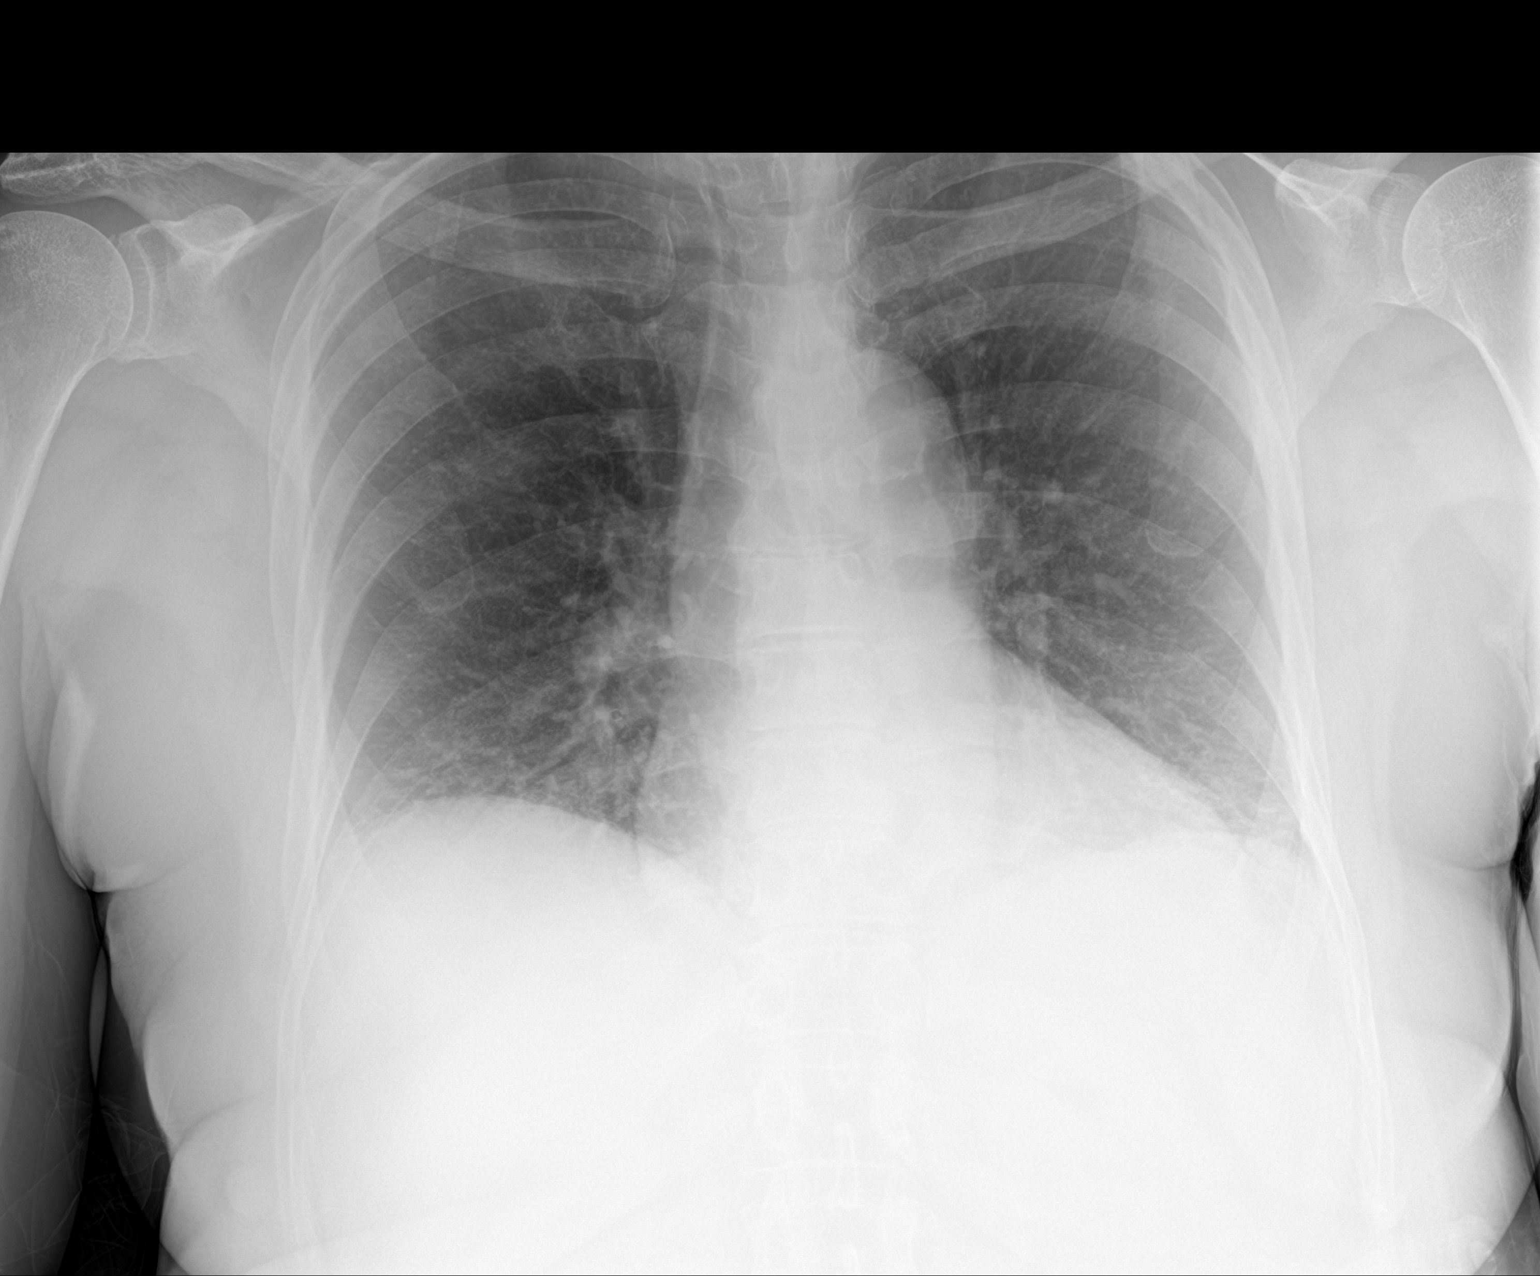

[2 of 2 positions shown; findings below may reference images not displayed]

FINDINGS: Heart size is normal. No pleural effusion identified. Lungs are
suboptimally inflated but clear. No airspace opacities. Mild
spondylosis within the thoracic spine..
IMPRESSION: 1. No acute cardiopulmonary abnormalities.
